# Patient Record
Sex: Female | Born: 1975 | Race: Black or African American | Hispanic: No | Marital: Single | Smoking: Never smoker
Health system: Southern US, Community
[De-identification: ages and names within clinical notes are randomized; demographics above are authoritative.]

## PROBLEM LIST (undated history)

## (undated) DIAGNOSIS — I10 Essential (primary) hypertension: Secondary | ICD-10-CM

## (undated) HISTORY — PX: CHOLECYSTECTOMY: SHX55

## (undated) HISTORY — PX: TONSILLECTOMY: SUR1361

---

## 2014-07-21 ENCOUNTER — Inpatient Hospital Stay: Payer: Self-pay | Admitting: Surgery

## 2014-07-21 LAB — CBC WITH DIFFERENTIAL/PLATELET
BASOS ABS: 0 10*3/uL (ref 0.0–0.1)
BASOS PCT: 0.4 %
EOS ABS: 0 10*3/uL (ref 0.0–0.7)
EOS PCT: 0.2 %
HCT: 43.4 % (ref 35.0–47.0)
HGB: 14 g/dL (ref 12.0–16.0)
LYMPHS ABS: 1.7 10*3/uL (ref 1.0–3.6)
Lymphocyte %: 14.5 %
MCH: 27.2 pg (ref 26.0–34.0)
MCHC: 32.3 g/dL (ref 32.0–36.0)
MCV: 84 fL (ref 80–100)
MONOS PCT: 5.4 %
Monocyte #: 0.6 x10 3/mm (ref 0.2–0.9)
NEUTROS PCT: 79.5 %
Neutrophil #: 9 10*3/uL — ABNORMAL HIGH (ref 1.4–6.5)
Platelet: 336 10*3/uL (ref 150–440)
RBC: 5.16 10*6/uL (ref 3.80–5.20)
RDW: 15.5 % — AB (ref 11.5–14.5)
WBC: 11.4 10*3/uL — AB (ref 3.6–11.0)

## 2014-07-21 LAB — LIPASE, BLOOD: Lipase: 133 U/L (ref 73–393)

## 2014-07-21 LAB — COMPREHENSIVE METABOLIC PANEL
Albumin: 3.3 g/dL — ABNORMAL LOW (ref 3.4–5.0)
Alkaline Phosphatase: 178 U/L — ABNORMAL HIGH
Anion Gap: 7 (ref 7–16)
BILIRUBIN TOTAL: 2 mg/dL — AB (ref 0.2–1.0)
BUN: 10 mg/dL (ref 7–18)
CALCIUM: 8.9 mg/dL (ref 8.5–10.1)
CHLORIDE: 104 mmol/L (ref 98–107)
CREATININE: 1.12 mg/dL (ref 0.60–1.30)
Co2: 27 mmol/L (ref 21–32)
EGFR (Non-African Amer.): >60
Glucose: 124 mg/dL — ABNORMAL HIGH (ref 65–99)
OSMOLALITY: 276 (ref 275–301)
Potassium: 3.8 mmol/L (ref 3.5–5.1)
SGOT(AST): 717 U/L — ABNORMAL HIGH (ref 15–37)
SGPT (ALT): 398 U/L — ABNORMAL HIGH
Sodium: 138 mmol/L (ref 136–145)
Total Protein: 7.9 g/dL (ref 6.4–8.2)

## 2014-07-21 LAB — TROPONIN I

## 2014-07-22 LAB — COMPREHENSIVE METABOLIC PANEL
ALBUMIN: 2.9 g/dL — AB (ref 3.4–5.0)
Alkaline Phosphatase: 202 U/L — ABNORMAL HIGH
Anion Gap: 8 (ref 7–16)
BUN: 9 mg/dL (ref 7–18)
Bilirubin,Total: 2 mg/dL — ABNORMAL HIGH (ref 0.2–1.0)
CALCIUM: 8.3 mg/dL — AB (ref 8.5–10.1)
Chloride: 107 mmol/L (ref 98–107)
Co2: 25 mmol/L (ref 21–32)
Creatinine: 1.08 mg/dL (ref 0.60–1.30)
EGFR (African American): >60
GLUCOSE: 118 mg/dL — AB (ref 65–99)
Osmolality: 279 (ref 275–301)
Potassium: 3.6 mmol/L (ref 3.5–5.1)
SGOT(AST): 610 U/L — ABNORMAL HIGH (ref 15–37)
SGPT (ALT): 508 U/L — ABNORMAL HIGH
Sodium: 140 mmol/L (ref 136–145)
Total Protein: 7.2 g/dL (ref 6.4–8.2)

## 2014-07-22 LAB — CBC WITH DIFFERENTIAL/PLATELET
Basophil #: 0.1 10*3/uL (ref 0.0–0.1)
Basophil %: 0.8 %
Eosinophil #: 0.1 10*3/uL (ref 0.0–0.7)
Eosinophil %: 1 %
HCT: 42.4 % (ref 35.0–47.0)
HGB: 13.2 g/dL (ref 12.0–16.0)
LYMPHS PCT: 19.3 %
Lymphocyte #: 2.1 10*3/uL (ref 1.0–3.6)
MCH: 26.5 pg (ref 26.0–34.0)
MCHC: 31.2 g/dL — ABNORMAL LOW (ref 32.0–36.0)
MCV: 85 fL (ref 80–100)
Monocyte #: 0.7 x10 3/mm (ref 0.2–0.9)
Monocyte %: 6.4 %
NEUTROS ABS: 8 10*3/uL — AB (ref 1.4–6.5)
Neutrophil %: 72.5 %
PLATELETS: 303 10*3/uL (ref 150–440)
RBC: 4.98 10*6/uL (ref 3.80–5.20)
RDW: 15.6 % — AB (ref 11.5–14.5)
WBC: 11.1 10*3/uL — ABNORMAL HIGH (ref 3.6–11.0)

## 2014-07-22 LAB — PREGNANCY, URINE: Pregnancy Test, Urine: NEGATIVE m[IU]/mL

## 2014-07-24 LAB — COMPREHENSIVE METABOLIC PANEL
ALK PHOS: 158 U/L — AB
Albumin: 2.8 g/dL — ABNORMAL LOW (ref 3.4–5.0)
Anion Gap: 7 (ref 7–16)
BUN: 11 mg/dL (ref 7–18)
Bilirubin,Total: 0.4 mg/dL (ref 0.2–1.0)
CALCIUM: 8.5 mg/dL (ref 8.5–10.1)
CHLORIDE: 107 mmol/L (ref 98–107)
CREATININE: 0.96 mg/dL (ref 0.60–1.30)
Co2: 24 mmol/L (ref 21–32)
EGFR (African American): >60
EGFR (Non-African Amer.): >60
Glucose: 102 mg/dL — ABNORMAL HIGH (ref 65–99)
Osmolality: 275 (ref 275–301)
Potassium: 4.2 mmol/L (ref 3.5–5.1)
SGOT(AST): 64 U/L — ABNORMAL HIGH (ref 15–37)
SGPT (ALT): 209 U/L — ABNORMAL HIGH
Sodium: 138 mmol/L (ref 136–145)
TOTAL PROTEIN: 7 g/dL (ref 6.4–8.2)

## 2014-07-25 LAB — PATHOLOGY REPORT

## 2014-08-04 ENCOUNTER — Emergency Department: Payer: Self-pay | Admitting: Emergency Medicine

## 2015-04-17 NOTE — Op Note (Signed)
PATIENT NAME:  Kristie Santos, Kristie MR#:  [id number] DATE OF BIRTH:  1976-01-25  DATE OF PROCEDURE:  07/23/2014  PREOPERATIVE DIAGNOSIS: Symptomatic cholelithiasis.   POSTOPERATIVE DIAGNOSIS: Symptomatic cholelithiasis.   PROCEDURE: Cholecystectomy.  SURGEON: Duwaine MaxinWilliam Beckett Hickmon, M.D.   ANESTHESIA: General.   PROCEDURE IN DETAIL: The patient was placed supine on the operating room table and prepped and draped in the usual sterile fashion. A 15 mmHg CO2 pneumoperitoneum was created via a Veress needle in the infraumbilical position and this was placed with a 5 mm trocar and a 30 degree angled laparoscope. The patient had no inflammation and a fairly small gallbladder. The fundus was retracted superiorly and ventrally. The infundibulum was dissected out. The cystic artery was doubly clipped and divided and then the infundibulum of the gallbladder was noted coned down into a fairly broad cystic duct without any clear definition as to where the gallbladder ended and the cystic duct began. I knew that I was above the porta hepatis as I was above the anterior posterior groove on the visceral surface of the liver, which was quite demarcated. Therefore, at that location, I doubly clipped the cystic duct with Hem-o-Lok clips and divided it. I removed the gallbladder from the liver bed with the electrocautery, placed it in an Endo Catch bag and extracted it from the abdomen via the epigastric port. There was no bile staining. There was no bleeding. The clips were secure. Therefore, I desufflated and decannulated the abdomen and closed all 4 skin sites with subcuticular 5-0 Monocryl and suture strips. The patient tolerated the procedure well. There were no complications.  ____________________________ Kristie MangesWilliam F. Debbra Digiulio, MD wfm:sb D: 07/23/2014 09:54:42 ET T: 07/23/2014 10:11:31 ET JOB#: [id number]  cc: Kristie MangesWilliam F. Kaveri Perras, MD, <Dictator> Kristie MangesWILLIAM F Tajae Rybicki MD ELECTRONICALLY SIGNED 07/23/2014 14:16

## 2015-04-17 NOTE — Consult Note (Signed)
Brief Consult Note: Diagnosis: Sx gallstones.   Patient was seen by consultant.   Recommend to proceed with surgery or procedure.   Orders entered.   Discussed with Attending MD.   Comments: Likely has been passing GSs causing LFT abnormalities and Sx. Pt aware of this and ERCP results. Plan Lap CCY in AM.  Pt understands 1/200 risk of CBD injury, and if it occurs, it could have serious consequences, and She agrees.  Electronic Signatures: Claude MangesMarterre, Haleem Hanner F (MD)  (Signed 29-Jul-15 14:01)  Authored: Brief Consult Note   Last Updated: 29-Jul-15 14:01 by Claude MangesMarterre, Markela Wee F (MD)

## 2015-04-17 NOTE — H&P (Signed)
PATIENT NAME:  Kristie Santos, STRUM MR#:  [id number] DATE OF BIRTH:  January 04, 1976  DATE OF ADMISSION:  07/21/2014  PRIMARY CARE PHYSICIAN: None.   REFERRING EMERGENCY ROOM PHYSICIAN: Dr. Jene Every.   CHIEF COMPLAINT: Abdominal pain.   HISTORY OF PRESENTING ILLNESS: A 39 year old female who does not have a PMD and does not go to a doctor regularly. Today morning she woke up suddenly at 6 a.m. with severe epigastric and abdominal pain. The pain was 10/10 like soreness and lying down in certain positions made it slightly better. She also tried some pain medications which make pain a little better and she was able to take a short nap again but after that again when she woke up the pain was still there. It was like soreness and was radiating to her back. The patient also vomited in the morning her breakfast and after that did not eat anything. No complaint of diarrhea, fever or chills so decided to come to the Emergency Room. On work-up in ER, elevated LFTs with a high bilirubin so right upper quadrant sonogram was done and it showed abnormal intrahepatic and extrahepatic biliary duct dilation with gallstone in gallbladder. So ER physician spoke to Dr. Daleen Squibb, on call GI doctor and he suggested to keep patient n.p.o. overnight and he will do an ERCP tomorrow. The hospitalist was contacted for further management of this issue.   REVIEW OF SYSTEMS:  CONSTITUTIONAL: Negative for fever, fatigue, weakness, have abdominal pain. No weight loss or weight gain.  EYES: No blurring, double vision, discharge or redness.  EARS, NOSE, THROAT: No tinnitus, ear pain or hearing loss.  RESPIRATORY: No cough, wheezing, or shortness of breath.  CARDIOVASCULAR: No chest pain, orthopnea, edema, arrhythmia, palpitation.  GASTROINTESTINAL: The patient had vomiting and severe abdominal pain but no diarrhea.  GENITOURINARY: No dysuria, hematuria, or increased frequency.  ENDOCRINE: No heat or cold intolerance. No excessive  sweating.  SKIN: No acne, rashes, or lesions.  MUSCULOSKELETAL: No pain or swelling in the joints.  NEUROLOGICAL: No numbness, weakness, tremor or vertigo.  PSYCHIATRIC: No anxiety, insomnia, bipolar disorder.   PAST MEDICAL HISTORY: Not known.   PAST SURGICAL HISTORY: Tonsillectomy.   SOCIAL HISTORY: No smoking. No drinking alcohol. No illegal drug use. Working in housekeeping.   FAMILY HISTORY: Positive for diabetes in mother. Her mother died at age of 43 because of complications of aneurysmal rupture in the brain.   MEDICATIONS: Prenatal vitamin medications.  PHYSICAL EXAMINATION:   VITAL SIGNS: In ER, temperature 98, pulse 55, respirations 18, blood pressure 171/104. Pulse oximetry is 98% on room air.  GENERAL: The patient is fully alert, oriented to time, place, and person. Does not appear in acute distress. She is obese.  HEENT: Head and neck atraumatic. Conjunctivae pink. Oral mucosa moist.  NECK: Supple. No JVD.  RESPIRATORY: Bilateral clear and equal air entry.  CARDIOVASCULAR: S1, S2 present, regular. No murmur.  ABDOMEN: Soft, nontender. Bowel sounds present. No organomegaly felt.  SKIN: No acne, rashes, or lesions.  MUSCULOSKELETAL: No pain. No swelling or tenderness in the joints.  NEUROLOGICAL: No numbness, weakness, tremor. No rigidity. Follows commands, (power  5/5.  LEGS: No edema.  JOINTS: No swelling or tenderness.  PSYCHIATRIC: No acute psychiatric illness.   IMPORTANT LAB VALUES: Glucose  124, BUN 10, creatinine 1.12, sodium 138, potassium is 3.8, chloride is 104, CO2 of 27, osmolality 276, calcium 8.9. Lipase is 133.   Total protein 7.9, albumin 3.3, bilirubin 2, alkaline phosphate 178, SGOT 1717 and SGPT  398. Troponin less than 0.02. WBC 11.4, hemoglobin 14, platelet 336,000. MCV is 84.   Ultrasound of abdomen, limited survey is done for right upper quadrant and showed abnormal intrahepatic and extrahepatic biliary duct dilation with gallstone in gallbladder  although we cannot directly visualize choledocholithiasis. The presence of biliary dilation and obvious gallstone in the gallbladder may make choledocholithiasis likely.   ASSESSMENT AND PLAN: A 39 year old female with no past medical history came with severe abdominal pain in the mo63rning, found having intra and extrahepatic ductal dilation with gallstone in the gallbladder.    1. Gallbladder stone with intra and extrahepatic ductal dilation and severe pain. ER physician spoke to Dr. Daleen SquibbWall, Gastrointestinal on-call doctor and he is willing to do ERCP tomorrow morning, suggested to keep the patient n.p.o. overnight and manage symptomatically. Plan discussed with the patient and she agrees for that and will do as needed.  2. Elevated LFTs. Most likely this is secondary to gallstone and management per GI tomorrow.  3. Elevated white cell count. This might be due to  stress of pain. No source of infection yet. Continue monitoring.  4. Hypertension. Blood pressure is high 170/104. This might be due to pain. Continue pain management and address later on if still remains high.   CODE STATUS: Full code.   TOTAL TIME SPENT ON THIS ADMISSION: 50 minutes.    ____________________________ Hope PigeonVaibhavkumar G. Elisabeth PigeonVachhani, MD vgv:jh D: 07/21/2014 21:20:40 ET T: 07/21/2014 22:01:16 ET JOB#: [id number]  cc: Hope PigeonVaibhavkumar G. Elisabeth PigeonVachhani, MD, <Dictator> Altamese DillingVAIBHAVKUMAR Nykia Turko MD ELECTRONICALLY SIGNED 08/03/2014 8:15

## 2015-04-17 NOTE — Discharge Summary (Signed)
PATIENT NAME:  Kristie DresserWILLIAMSON, Sinclaire MR#:  [id number] DATE OF BIRTH:  1976-11-24  DATE OF ADMISSION:  07/21/2014 DATE OF DISCHARGE:  07/24/2014  PRINCIPAL DIAGNOSIS: Symptomatic cholelithiasis (history of passing common bile duct stones).   OTHER DIAGNOSES: 1.  Hypertension. 2.  Obesity.  PRINCIPAL PROCEDURE PERFORMED DURING THIS ADMISSION: Laparoscopic cholecystectomy, July 23, 2014.   OTHER PROCEDURES PERFORMED DURING THIS ADMISSION: ERCP July 22, 2014.   HOSPITAL COURSE: The patient was admitted to the hospital, given IV antibiotics and underwent the above-mentioned procedures for the above-mentioned diagnosis, and on postoperative day 1 she was doing well and discharged home on Norco. She was asked to make an appointment to see me in 2 weeks and to call the office in the interim for any problems.   ____________________________ Claude MangesWilliam F. Christalyn Goertz, MD wfm:sb D: 07/24/2014 10:30:31 ET T: 07/24/2014 12:24:07 ET JOB#: [id number]  cc: Claude MangesWilliam F. Itzamara Casas, MD, <Dictator> Claude MangesWILLIAM F Genella Bas MD ELECTRONICALLY SIGNED 07/25/2014 9:34

## 2015-06-18 ENCOUNTER — Other Ambulatory Visit: Payer: Self-pay

## 2015-06-18 ENCOUNTER — Emergency Department
Admission: EM | Admit: 2015-06-18 | Discharge: 2015-06-18 | Disposition: A | Discharge status: Home / Self Care | Payer: Commercial Managed Care - PPO | Attending: Emergency Medicine | Admitting: Emergency Medicine

## 2015-06-18 ENCOUNTER — Encounter: Payer: Self-pay | Admitting: *Deleted

## 2015-06-18 ENCOUNTER — Emergency Department: Payer: Commercial Managed Care - PPO

## 2015-06-18 DIAGNOSIS — R079 Chest pain, unspecified: Secondary | ICD-10-CM

## 2015-06-18 DIAGNOSIS — R0789 Other chest pain: Secondary | ICD-10-CM | POA: Diagnosis present

## 2015-06-18 DIAGNOSIS — Z3202 Encounter for pregnancy test, result negative: Secondary | ICD-10-CM | POA: Diagnosis not present

## 2015-06-18 LAB — CBC WITH DIFFERENTIAL/PLATELET
BASOS PCT: 1 %
Basophils Absolute: 0.1 10*3/uL (ref 0–0.1)
Eosinophils Absolute: 0.2 10*3/uL (ref 0–0.7)
Eosinophils Relative: 2 %
HCT: 40.7 % (ref 35.0–47.0)
Hemoglobin: 12.9 g/dL (ref 12.0–16.0)
LYMPHS ABS: 3.1 10*3/uL (ref 1.0–3.6)
Lymphocytes Relative: 26 %
MCH: 26 pg (ref 26.0–34.0)
MCHC: 31.6 g/dL — AB (ref 32.0–36.0)
MCV: 82.2 fL (ref 80.0–100.0)
Monocytes Absolute: 0.7 10*3/uL (ref 0.2–0.9)
Monocytes Relative: 6 %
NEUTROS PCT: 65 %
Neutro Abs: 7.7 10*3/uL — ABNORMAL HIGH (ref 1.4–6.5)
Platelets: 325 10*3/uL (ref 150–440)
RBC: 4.95 MIL/uL (ref 3.80–5.20)
RDW: 15.5 % — ABNORMAL HIGH (ref 11.5–14.5)
WBC: 11.8 10*3/uL — ABNORMAL HIGH (ref 3.6–11.0)

## 2015-06-18 LAB — COMPREHENSIVE METABOLIC PANEL
ALK PHOS: 90 U/L (ref 38–126)
ALT: 29 U/L (ref 14–54)
ANION GAP: 6 (ref 5–15)
AST: 44 U/L — ABNORMAL HIGH (ref 15–41)
Albumin: 3.8 g/dL (ref 3.5–5.0)
BILIRUBIN TOTAL: 0.5 mg/dL (ref 0.3–1.2)
BUN: 13 mg/dL (ref 6–20)
CHLORIDE: 104 mmol/L (ref 101–111)
CO2: 29 mmol/L (ref 22–32)
Calcium: 9 mg/dL (ref 8.9–10.3)
Creatinine, Ser: 1.17 mg/dL — ABNORMAL HIGH (ref 0.44–1.00)
GFR, EST NON AFRICAN AMERICAN: 58 mL/min — AB (ref >60)
GLUCOSE: 108 mg/dL — AB (ref 65–99)
POTASSIUM: 4 mmol/L (ref 3.5–5.1)
SODIUM: 139 mmol/L (ref 135–145)
TOTAL PROTEIN: 7.4 g/dL (ref 6.5–8.1)

## 2015-06-18 LAB — TROPONIN I: Troponin I: <0.03 ng/mL (ref <0.031)

## 2015-06-18 NOTE — Discharge Instructions (Signed)

## 2015-06-18 NOTE — ED Notes (Signed)
Pt presents to ED for c/o mid sternal chest pain that woke her up at 0400, sharp in nature with pain to her back. Pt states pain and symptoms are similar to 1 year ago when she awoke with same symptoms and ending up having her gallbladder removed. Denies N/V, diaphoresis. Pt is AOx4 in no apparent distress during assessment.

## 2015-06-18 NOTE — ED Provider Notes (Signed)
[facility] Emergency Department Provider Note  ____________________________________________  Time seen: 7 AM  I have reviewed the triage vital signs and the nursing notes.   HISTORY  Chief Complaint Chest Pain    HPI Kristie Santos is a 39 y.o. female who woke up this morning at 4 AM with burning/sharp pain in the center of her chest radiating from her epigastrium to her throat. She reports the pain is improved. She denies shortness of breath. She denies nausea vomiting. No diaphoresis. She has had similar problems in the past and has required her gallbladder to be removed. She has no history of coronary artery disease. Denies a family history of coronary artery disease. She currently has no chest pain and feels well     History reviewed. No pertinent past medical history. Hypertension? There are no active problems to display for this patient.   Past Surgical History  Procedure Laterality Date  . Cholecystectomy    . Tonsillectomy      No current outpatient prescriptions on file.  Allergies Review of patient's allergies indicates no known allergies.  No family history on file.  Social History History  Substance Use Topics  . Smoking status: Never Smoker   . Smokeless tobacco: Not on file  . Alcohol Use: No   denies drug abuse  Review of Systems  Constitutional: Negative for fever. Eyes: Negative for visual changes. ENT: Negative for sore throat Cardiovascular: Positive for chest pain Respiratory: Negative for shortness of breath. Gastrointestinal: Negative for abdominal pain, vomiting and diarrhea. Genitourinary: Negative for dysuria. Musculoskeletal: Negative for back pain. Skin: Negative for rash. Neurological: Negative for headaches or focal weakness Psychiatric: No anxiety  10-point ROS otherwise negative.  ____________________________________________   PHYSICAL EXAM:  VITAL SIGNS: ED Triage Vitals  Enc Vitals Group      BP 06/18/15 0501 151/76 mmHg     Pulse Rate 06/18/15 0501 57     Resp 06/18/15 0501 16     Temp 06/18/15 0501 97.8 F (36.6 C)     Temp Source 06/18/15 0501 Oral     SpO2 06/18/15 0501 99 %     Weight 06/18/15 0501 234 lb (106.142 kg)     Height 06/18/15 0501  (1.549 m)     Head Cir --      Peak Flow --      Pain Score 06/18/15 0504 7     Pain Loc --      Pain Edu? --      Excl. in GC? --      Constitutional: Alert and oriented. Well appearing and in no distress. Eyes: Conjunctivae are normal.  ENT   Head: Normocephalic and atraumatic.   Mouth/Throat: Mucous membranes are moist. Cardiovascular: Normal rate, regular rhythm. Normal and symmetric distal pulses are present in all extremities. No murmurs, rubs, or gallops. Respiratory: Normal respiratory effort without tachypnea nor retractions. Breath sounds are clear and equal bilaterally.  Gastrointestinal: Soft and non-tender in all quadrants. No distention. There is no CVA tenderness. Genitourinary: deferred Musculoskeletal: Nontender with normal range of motion in all extremities. No lower extremity tenderness nor edema. Neurologic:  Normal speech and language. No gross focal neurologic deficits are appreciated. Skin:  Skin is warm, dry and intact. No rash noted. Psychiatric: Mood and affect are normal. Patient exhibits appropriate insight and judgment.  ____________________________________________    LABS (pertinent positives/negatives)  Labs Reviewed  CBC WITH DIFFERENTIAL/PLATELET - Abnormal; Notable for the following:    WBC 11.8 (*)  MCHC 31.6 (*)    RDW 15.5 (*)    Neutro Abs 7.7 (*)    All other components within normal limits  COMPREHENSIVE METABOLIC PANEL  TROPONIN I    ____________________________________________   EKG  ED ECG REPORT I, Jene Every, the attending physician, personally viewed and interpreted this ECG.   Date: 06/18/2015  EKG Time: 5:14 AM  Rate: 49  Rhythm:  sinus bradycardia  Axis: Normal axis  Intervals:none  ST&T Change: Normal   ____________________________________________    RADIOLOGY  Chest x-ray viewed by me, no acute distress  ____________________________________________   PROCEDURES  Procedure(s) performed: none  Critical Care performed: none  ____________________________________________   INITIAL IMPRESSION / ASSESSMENT AND PLAN / ED COURSE  Pertinent labs & imaging results that were available during my care of the patient were reviewed by me and considered in my medical decision making (see chart for details).  Patient overall well-appearing, history of present illness and risk factors to demonstrate a low risk for ACS however we will check labs, chest x-ray and reevaluate. She is chest pain-free.  ----------------------------------------- 8:29 AM on 06/18/2015 -----------------------------------------  Labs x-ray benign. Patient is chest pain-free. She has a very slight bradycardia but no other abnormalities on EKG. History of present illness not consistent with ACS or PE. She agrees to follow up with cardiology for further follow-up of her chest pain and bradycardia  ____________________________________________   FINAL CLINICAL IMPRESSION(S) / ED DIAGNOSES  Final diagnoses:  Chest pain, unspecified chest pain type     Jene Every, MD 06/18/15 [phone]

## 2015-06-18 NOTE — ED Notes (Signed)
Pt presents to ED for c/o mid sternal chest pain that woke her up at 0400, sharp in nature with pain to her back. Pt states pain and symptoms are similar to 1 year ago when she awoke with same symptoms and ending up having her gallbladder removed. Denies N/V, diaphoresis.

## 2015-06-19 LAB — POCT PREGNANCY, URINE: Preg Test, Ur: NEGATIVE

## 2019-06-07 ENCOUNTER — Emergency Department (HOSPITAL_COMMUNITY): Payer: Self-pay

## 2019-06-07 ENCOUNTER — Encounter (HOSPITAL_COMMUNITY): Payer: Self-pay

## 2019-06-07 ENCOUNTER — Emergency Department (HOSPITAL_COMMUNITY)
Admission: EM | Admit: 2019-06-07 | Discharge: 2019-06-07 | Disposition: A | Discharge status: Home / Self Care | Payer: Self-pay | Attending: Emergency Medicine | Admitting: Emergency Medicine

## 2019-06-07 ENCOUNTER — Other Ambulatory Visit: Payer: Self-pay

## 2019-06-07 DIAGNOSIS — R1084 Generalized abdominal pain: Secondary | ICD-10-CM | POA: Insufficient documentation

## 2019-06-07 DIAGNOSIS — R197 Diarrhea, unspecified: Secondary | ICD-10-CM | POA: Insufficient documentation

## 2019-06-07 DIAGNOSIS — K55069 Acute infarction of intestine, part and extent unspecified: Secondary | ICD-10-CM | POA: Insufficient documentation

## 2019-06-07 LAB — URINALYSIS, ROUTINE W REFLEX MICROSCOPIC
Bacteria, UA: NONE SEEN
Bilirubin Urine: NEGATIVE
Glucose, UA: 50 mg/dL — AB
Ketones, ur: NEGATIVE mg/dL
Nitrite: NEGATIVE
Protein, ur: 100 mg/dL — AB
RBC / HPF: >50 RBC/hpf — ABNORMAL HIGH (ref 0–5)
Specific Gravity, Urine: 1.03 (ref 1.005–1.030)
WBC, UA: >50 WBC/hpf — ABNORMAL HIGH (ref 0–5)
pH: 5 (ref 5.0–8.0)

## 2019-06-07 LAB — CBC WITH DIFFERENTIAL/PLATELET
Abs Immature Granulocytes: 0.31 10*3/uL — ABNORMAL HIGH (ref 0.00–0.07)
Basophils Absolute: 0.1 10*3/uL (ref 0.0–0.1)
Basophils Relative: 0 %
Eosinophils Absolute: 0 10*3/uL (ref 0.0–0.5)
Eosinophils Relative: 0 %
HCT: 41.6 % (ref 36.0–46.0)
Hemoglobin: 12.8 g/dL (ref 12.0–15.0)
Immature Granulocytes: 1 %
Lymphocytes Relative: 9 %
Lymphs Abs: 2.5 10*3/uL (ref 0.7–4.0)
MCH: 25.6 pg — ABNORMAL LOW (ref 26.0–34.0)
MCHC: 30.8 g/dL (ref 30.0–36.0)
MCV: 83.2 fL (ref 80.0–100.0)
Monocytes Absolute: 1.3 10*3/uL — ABNORMAL HIGH (ref 0.1–1.0)
Monocytes Relative: 5 %
Neutro Abs: 23.1 10*3/uL — ABNORMAL HIGH (ref 1.7–7.7)
Neutrophils Relative %: 85 %
Platelets: 341 10*3/uL (ref 150–400)
RBC: 5 MIL/uL (ref 3.87–5.11)
RDW: 15.9 % — ABNORMAL HIGH (ref 11.5–15.5)
WBC: 27.1 10*3/uL — ABNORMAL HIGH (ref 4.0–10.5)
nRBC: 0 % (ref 0.0–0.2)

## 2019-06-07 LAB — COMPREHENSIVE METABOLIC PANEL
ALT: 27 U/L (ref 0–44)
AST: 24 U/L (ref 15–41)
Albumin: 3.2 g/dL — ABNORMAL LOW (ref 3.5–5.0)
Alkaline Phosphatase: 71 U/L (ref 38–126)
Anion gap: 11 (ref 5–15)
BUN: 11 mg/dL (ref 6–20)
CO2: 20 mmol/L — ABNORMAL LOW (ref 22–32)
Calcium: 8.9 mg/dL (ref 8.9–10.3)
Chloride: 106 mmol/L (ref 98–111)
Creatinine, Ser: 1.39 mg/dL — ABNORMAL HIGH (ref 0.44–1.00)
GFR calc Af Amer: 54 mL/min — ABNORMAL LOW (ref >60)
GFR calc non Af Amer: 47 mL/min — ABNORMAL LOW (ref >60)
Glucose, Bld: 129 mg/dL — ABNORMAL HIGH (ref 70–99)
Potassium: 2.9 mmol/L — ABNORMAL LOW (ref 3.5–5.1)
Sodium: 137 mmol/L (ref 135–145)
Total Bilirubin: 2.3 mg/dL — ABNORMAL HIGH (ref 0.3–1.2)
Total Protein: 7.3 g/dL (ref 6.5–8.1)

## 2019-06-07 LAB — I-STAT BETA HCG BLOOD, ED (MC, WL, AP ONLY): I-stat hCG, quantitative: <5 m[IU]/mL (ref <5)

## 2019-06-07 LAB — LIPASE, BLOOD: Lipase: 26 U/L (ref 11–51)

## 2019-06-07 LAB — POC OCCULT BLOOD, ED: Fecal Occult Bld: NEGATIVE

## 2019-06-07 LAB — MAGNESIUM: Magnesium: 1.8 mg/dL (ref 1.7–2.4)

## 2019-06-07 MED ORDER — KETOROLAC TROMETHAMINE 15 MG/ML IJ SOLN
15.0000 mg | Freq: Once | INTRAMUSCULAR | Status: AC
  Administered 2019-06-07: 15 mg via INTRAVENOUS
  Filled 2019-06-07: qty 1

## 2019-06-07 MED ORDER — SODIUM CHLORIDE 0.9 % IV BOLUS (SEPSIS)
1000.0000 mL | Freq: Once | INTRAVENOUS | Status: AC
  Administered 2019-06-07: 1000 mL via INTRAVENOUS

## 2019-06-07 MED ORDER — IOHEXOL 300 MG/ML  SOLN
100.0000 mL | Freq: Once | INTRAMUSCULAR | Status: AC | PRN
  Administered 2019-06-07: 100 mL via INTRAVENOUS

## 2019-06-07 MED ORDER — POTASSIUM CHLORIDE CRYS ER 20 MEQ PO TBCR
40.0000 meq | EXTENDED_RELEASE_TABLET | Freq: Once | ORAL | Status: AC
  Administered 2019-06-07: 40 meq via ORAL
  Filled 2019-06-07: qty 2

## 2019-06-07 MED ORDER — IBUPROFEN 800 MG PO TABS: 800.0000 mg | ORAL_TABLET | Freq: Three times a day (TID) | ORAL | 0 refills | Status: DC

## 2019-06-07 NOTE — Discharge Instructions (Signed)
Thank you for allowing me to care for you today. Please return to the emergency department if you have ANY new or worsening symptoms. Take your medications as instructed. You may take over the counter imodium for diarrhea. Make sure to drink PLENTY of water and return if you are unable to do so.

## 2019-06-07 NOTE — ED Notes (Signed)
Patient transported to CT 

## 2019-06-07 NOTE — ED Provider Notes (Signed)
[facility] Provider Note   CSN: [id number] Arrival date & time: 06/07/19  1328    History   Chief Complaint Chief Complaint  Patient presents with  . Diarrhea  . Abdominal Pain    HPI Kristie Santos is a 43 y.o. female.     Patient is a 43 year old female with past medical history of cholecystectomy who presents to the emergency department for abdominal pain and diarrhea since last night.  Patient reports about 5 episodes of watery diarrhea with crampy abdominal pain over her entire abdomen.  She denies any fevers, chills, nausea, vomiting, dysuria.  She reports that she is on her menstrual cycle so she is unsure if there is blood in her stool.  Reports the pain is 8 out of 10.  No exacerbating or relieving factors.  No history of the same in the past.  Denies known sick contacts, denies recent antibiotics or dietary changes.     History reviewed. No pertinent past medical history.  There are no active problems to display for this patient.   Past Surgical History:  Procedure Laterality Date  . CHOLECYSTECTOMY    . TONSILLECTOMY       OB History   No obstetric history on file.      Home Medications    Prior to Admission medications   Medication Sig Start Date End Date Taking? Authorizing Provider  ibuprofen (ADVIL) 800 MG tablet Take 1 tablet (800 mg total) by mouth 3 (three) times daily. 06/07/19   Arlyn DunningMcLean, Thayden Lemire A, PA-C    Family History No family history on file.  Social History Social History   Tobacco Use  . Smoking status: Never Smoker  Substance Use Topics  . Alcohol use: No  . Drug use: No     Allergies   Patient has no known allergies.   Review of Systems Review of Systems  Constitutional: Negative for chills, fatigue and fever.  HENT: Negative for ear pain and sore throat.   Eyes: Negative for visual disturbance.  Respiratory: Negative for cough and shortness of breath.   Cardiovascular:  Negative for chest pain and palpitations.  Gastrointestinal: Positive for abdominal pain and diarrhea. Negative for abdominal distention, anal bleeding, constipation, nausea, rectal pain and vomiting.  Genitourinary: Negative for dysuria, flank pain, hematuria and pelvic pain.  Musculoskeletal: Negative for arthralgias and back pain.  Skin: Negative for rash.  Allergic/Immunologic: Negative for immunocompromised state.  Neurological: Negative for dizziness and syncope.  Hematological: Does not bruise/bleed easily.  All other systems reviewed and are negative.    Physical Exam Updated Vital Signs BP 127/77   Pulse 88   Temp 99.4 F (37.4 C) (Oral)   Resp 16   LMP 06/05/2019 (Exact Date)   SpO2 96%   Physical Exam Vitals signs and nursing note reviewed. Exam conducted with a chaperone present.  Constitutional:      General: She is not in acute distress.    Appearance: Normal appearance. She is well-developed. She is obese. She is not ill-appearing, toxic-appearing or diaphoretic.  HENT:     Head: Normocephalic.     Mouth/Throat:     Mouth: Mucous membranes are moist.  Eyes:     Conjunctiva/sclera: Conjunctivae normal.  Cardiovascular:     Rate and Rhythm: Normal rate and regular rhythm.  Pulmonary:     Effort: Pulmonary effort is normal.  Abdominal:     General: Abdomen is flat. Bowel sounds are normal.     Palpations: Abdomen  is soft. There is no shifting dullness.     Tenderness: There is generalized abdominal tenderness. There is no right CVA tenderness, left CVA tenderness, guarding or rebound. Negative signs include Murphy's sign and Rovsing's sign.  Genitourinary:    Rectum: No mass or tenderness.  Skin:    General: Skin is warm and dry.  Neurological:     Mental Status: She is alert.  Psychiatric:        Mood and Affect: Mood normal.      ED Treatments / Results  Labs (all labs ordered are listed, but only abnormal results are displayed) Labs Reviewed   CBC WITH DIFFERENTIAL/PLATELET - Abnormal; Notable for the following components:      Result Value   WBC 27.1 (*)    MCH 25.6 (*)    RDW 15.9 (*)    Neutro Abs 23.1 (*)    Monocytes Absolute 1.3 (*)    Abs Immature Granulocytes 0.31 (*)    All other components within normal limits  COMPREHENSIVE METABOLIC PANEL - Abnormal; Notable for the following components:   Potassium 2.9 (*)    CO2 20 (*)    Glucose, Bld 129 (*)    Creatinine, Ser 1.39 (*)    Albumin 3.2 (*)    Total Bilirubin 2.3 (*)    GFR calc non Af Amer 47 (*)    GFR calc Af Amer 54 (*)    All other components within normal limits  URINALYSIS, ROUTINE W REFLEX MICROSCOPIC - Abnormal; Notable for the following components:   Color, Urine AMBER (*)    APPearance CLOUDY (*)    Glucose, UA 50 (*)    Hgb urine dipstick LARGE (*)    Protein, ur 100 (*)    Leukocytes,Ua TRACE (*)    RBC / HPF >50 (*)    WBC, UA >50 (*)    All other components within normal limits  LIPASE, BLOOD  MAGNESIUM  POC OCCULT BLOOD, ED  I-STAT BETA HCG BLOOD, ED ([facility], [facility], [facility] ONLY)    EKG None  Radiology Ct Abdomen Pelvis W Contrast  Result Date: 06/07/2019 CLINICAL DATA:  Pain since last night.  Mid abdominal pain EXAM: CT ABDOMEN AND PELVIS WITH CONTRAST TECHNIQUE: Multidetector CT imaging of the abdomen and pelvis was performed using the standard protocol following bolus administration of intravenous contrast. CONTRAST:  100mL OMNIPAQUE IOHEXOL 300 MG/ML  SOLN COMPARISON:  Mid abdominal pain FINDINGS: Lower chest: Lung bases are clear. Hepatobiliary: No focal hepatic lesion. Postcholecystectomy. No biliary dilatation. Pancreas: Pancreas is normal. No ductal dilatation. No pancreatic inflammation. Spleen: Normal spleen Adrenals/urinary tract: Adrenal glands and kidneys are normal. The ureters and bladder normal. Stomach/Bowel: Small-bowel normal. Terminal ileum and appendix are normal. Ascending and transverse colon normal. Descending colon and  rectosigmoid colon normal. There is mild mesenteric stranding in the RIGHT the greater omentum (image 44/3). No nodularity. Subtle finding adjacent to the cecum and small bowel. Vascular/Lymphatic: Abdominal aorta is normal caliber. No periportal or retroperitoneal adenopathy. No pelvic adenopathy. Reproductive: Uterus and necks are normal. Other: No free fluid. Musculoskeletal: No aggressive osseous lesion. IMPRESSION: 1. Subtle inflammation within the RIGHT omentum with no associated bowel abnormality. Favor omental infarction. 2. Postcholecystectomy without complication. 3. Normal appendix. 4. Normal ovaries and uterus. Electronically Signed   By: Genevive BiStewart  Edmunds M.D.   On: 06/07/2019 16:35    Procedures Procedures (including critical care time)  Medications Ordered in ED Medications  ketorolac (TORADOL) 15 MG/ML injection 15 mg (15 mg  Intravenous Given 06/07/19 1414)  sodium chloride 0.9 % bolus 1,000 mL (0 mLs Intravenous Stopped 06/07/19 1640)  potassium chloride SA (K-DUR) CR tablet 40 mEq (40 mEq Oral Given 06/07/19 1505)  iohexol (OMNIPAQUE) 300 MG/ML solution 100 mL (100 mLs Intravenous Contrast Given 06/07/19 1558)     Initial Impression / Assessment and Plan / ED Course  I have reviewed the triage vital signs and the nursing notes.  Pertinent labs & imaging results that were available during my care of the patient were reviewed by me and considered in my medical decision making (see chart for details).  Clinical Course as of Jun 06 1654  Sat Jun 07, 2019  1645 Patient seen for acute onset of diffuse abdominal pain and diarrhea.  Afebrile with normal vital signs.  Labs significant with dehydration with a small bump in the creatinine to 1.39 and a potassium of 2.9.  Bilirubin 2.3 but otherwise AST and ALT are normal.  Elevated white count to 27.  CT scan findings are concerning only for possible omentum infarction.  This may be the cause of patient's abdominal pain.  However, this would  not cause diarrhea.  I favor that this patient probably has a viral gastroenteritis.  She was given fluids and Toradol.  She reports significant improvement in her pain with no episodes of diarrhea here in the emergency department.  I think she is okay to go home with strict return precautions.  This was discussed with the patient and she agrees that she would like to go home.  We will give NSAID treatment.  Her occult stool was negative.  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

## 2019-06-07 NOTE — ED Triage Notes (Signed)
Pt presents with c/o upper quadrant abdominal pain and diarrhea since last night. Pt endorses x5 episodes of diarrhea. Pt states pain is constant. Pt denies NV, denies difficulty with urination. Pt denies any sick contacts.

## 2019-07-19 ENCOUNTER — Encounter: Payer: Self-pay | Admitting: Emergency Medicine

## 2019-07-19 ENCOUNTER — Emergency Department: Payer: Self-pay

## 2019-07-19 ENCOUNTER — Inpatient Hospital Stay
Admission: EM | Admit: 2019-07-19 | Discharge: 2019-07-27 | DRG: 872 | Disposition: A | Discharge status: Home / Self Care | Payer: Self-pay | Attending: Internal Medicine | Admitting: Internal Medicine

## 2019-07-19 ENCOUNTER — Other Ambulatory Visit: Payer: Self-pay

## 2019-07-19 DIAGNOSIS — N739 Female pelvic inflammatory disease, unspecified: Secondary | ICD-10-CM | POA: Diagnosis present

## 2019-07-19 DIAGNOSIS — N9489 Other specified conditions associated with female genital organs and menstrual cycle: Secondary | ICD-10-CM

## 2019-07-19 DIAGNOSIS — N390 Urinary tract infection, site not specified: Secondary | ICD-10-CM | POA: Diagnosis present

## 2019-07-19 DIAGNOSIS — N179 Acute kidney failure, unspecified: Secondary | ICD-10-CM | POA: Diagnosis present

## 2019-07-19 DIAGNOSIS — N7093 Salpingitis and oophoritis, unspecified: Secondary | ICD-10-CM | POA: Diagnosis present

## 2019-07-19 DIAGNOSIS — A419 Sepsis, unspecified organism: Principal | ICD-10-CM | POA: Diagnosis present

## 2019-07-19 DIAGNOSIS — E860 Dehydration: Secondary | ICD-10-CM | POA: Diagnosis present

## 2019-07-19 DIAGNOSIS — I10 Essential (primary) hypertension: Secondary | ICD-10-CM | POA: Diagnosis present

## 2019-07-19 DIAGNOSIS — Z20828 Contact with and (suspected) exposure to other viral communicable diseases: Secondary | ICD-10-CM

## 2019-07-19 DIAGNOSIS — E876 Hypokalemia: Secondary | ICD-10-CM | POA: Diagnosis present

## 2019-07-19 DIAGNOSIS — R6520 Severe sepsis without septic shock: Secondary | ICD-10-CM | POA: Diagnosis present

## 2019-07-19 DIAGNOSIS — Z6841 Body Mass Index (BMI) 40.0 and over, adult: Secondary | ICD-10-CM

## 2019-07-19 DIAGNOSIS — N83201 Unspecified ovarian cyst, right side: Secondary | ICD-10-CM | POA: Diagnosis present

## 2019-07-19 DIAGNOSIS — E872 Acidosis: Secondary | ICD-10-CM | POA: Diagnosis present

## 2019-07-19 DIAGNOSIS — R197 Diarrhea, unspecified: Secondary | ICD-10-CM

## 2019-07-19 HISTORY — DX: Essential (primary) hypertension: I10

## 2019-07-19 LAB — URINALYSIS, COMPLETE (UACMP) WITH MICROSCOPIC
Glucose, UA: NEGATIVE mg/dL
Ketones, ur: 5 mg/dL — AB
Nitrite: NEGATIVE
Protein, ur: 100 mg/dL — AB
Specific Gravity, Urine: 1.032 — ABNORMAL HIGH (ref 1.005–1.030)
WBC, UA: >50 WBC/hpf — ABNORMAL HIGH (ref 0–5)
pH: 5 (ref 5.0–8.0)

## 2019-07-19 LAB — GASTROINTESTINAL PANEL BY PCR, STOOL (REPLACES STOOL CULTURE)

## 2019-07-19 LAB — COMPREHENSIVE METABOLIC PANEL
ALT: 23 U/L (ref 0–44)
AST: 23 U/L (ref 15–41)
Albumin: 3.5 g/dL (ref 3.5–5.0)
Alkaline Phosphatase: 65 U/L (ref 38–126)
Anion gap: 9 (ref 5–15)
BUN: 17 mg/dL (ref 6–20)
CO2: 21 mmol/L — ABNORMAL LOW (ref 22–32)
Calcium: 8.5 mg/dL — ABNORMAL LOW (ref 8.9–10.3)
Chloride: 105 mmol/L (ref 98–111)
Creatinine, Ser: 1.64 mg/dL — ABNORMAL HIGH (ref 0.44–1.00)
GFR calc Af Amer: 44 mL/min — ABNORMAL LOW (ref >60)
GFR calc non Af Amer: 38 mL/min — ABNORMAL LOW (ref >60)
Glucose, Bld: 139 mg/dL — ABNORMAL HIGH (ref 70–99)
Potassium: 2.9 mmol/L — ABNORMAL LOW (ref 3.5–5.1)
Sodium: 135 mmol/L (ref 135–145)
Total Bilirubin: 1.3 mg/dL — ABNORMAL HIGH (ref 0.3–1.2)
Total Protein: 7.8 g/dL (ref 6.5–8.1)

## 2019-07-19 LAB — POCT PREGNANCY, URINE: Preg Test, Ur: NEGATIVE

## 2019-07-19 LAB — CBC
HCT: 39.2 % (ref 36.0–46.0)
Hemoglobin: 12.5 g/dL (ref 12.0–15.0)
MCH: 25.6 pg — ABNORMAL LOW (ref 26.0–34.0)
MCHC: 31.9 g/dL (ref 30.0–36.0)
MCV: 80.2 fL (ref 80.0–100.0)
Platelets: 254 10*3/uL (ref 150–400)
RBC: 4.89 MIL/uL (ref 3.87–5.11)
RDW: 16.5 % — ABNORMAL HIGH (ref 11.5–15.5)
WBC: 25.6 10*3/uL — ABNORMAL HIGH (ref 4.0–10.5)
nRBC: 0 % (ref 0.0–0.2)

## 2019-07-19 LAB — LIPASE, BLOOD: Lipase: 29 U/L (ref 11–51)

## 2019-07-19 LAB — C DIFFICILE QUICK SCREEN W PCR REFLEX
C Diff antigen: NEGATIVE
C Diff interpretation: NOT DETECTED
C Diff toxin: NEGATIVE

## 2019-07-19 LAB — SARS CORONAVIRUS 2 BY RT PCR (HOSPITAL ORDER, PERFORMED IN ~~LOC~~ HOSPITAL LAB): SARS Coronavirus 2: NEGATIVE

## 2019-07-19 MED ORDER — ACETAMINOPHEN 325 MG PO TABS
650.0000 mg | ORAL_TABLET | Freq: Once | ORAL | Status: AC
  Administered 2019-07-19: 650 mg via ORAL
  Filled 2019-07-19: qty 2

## 2019-07-19 MED ORDER — IOHEXOL 300 MG/ML  SOLN
30.0000 mL | Freq: Once | INTRAMUSCULAR | Status: AC | PRN
  Administered 2019-07-19: 15:00:00 30 mL via ORAL

## 2019-07-19 MED ORDER — CIPROFLOXACIN IN D5W 400 MG/200ML IV SOLN
400.0000 mg | Freq: Once | INTRAVENOUS | Status: AC
  Administered 2019-07-19: 400 mg via INTRAVENOUS
  Filled 2019-07-19: qty 200

## 2019-07-19 MED ORDER — POTASSIUM CHLORIDE CRYS ER 20 MEQ PO TBCR
40.0000 meq | EXTENDED_RELEASE_TABLET | Freq: Once | ORAL | Status: AC
  Administered 2019-07-19: 40 meq via ORAL
  Filled 2019-07-19: qty 2

## 2019-07-19 MED ORDER — SODIUM CHLORIDE 0.9 % IV BOLUS
1000.0000 mL | Freq: Once | INTRAVENOUS | Status: AC
  Administered 2019-07-19: 1000 mL via INTRAVENOUS

## 2019-07-19 MED ORDER — METRONIDAZOLE IN NACL 5-0.79 MG/ML-% IV SOLN
500.0000 mg | Freq: Once | INTRAVENOUS | Status: AC
  Administered 2019-07-19: 500 mg via INTRAVENOUS
  Filled 2019-07-19: qty 100

## 2019-07-19 MED ORDER — IOHEXOL 300 MG/ML  SOLN
100.0000 mL | Freq: Once | INTRAMUSCULAR | Status: AC | PRN
  Administered 2019-07-19: 100 mL via INTRAVENOUS

## 2019-07-19 MED ORDER — SODIUM CHLORIDE 0.9 % IV BOLUS
500.0000 mL | Freq: Once | INTRAVENOUS | Status: AC
  Administered 2019-07-19: 500 mL via INTRAVENOUS

## 2019-07-19 NOTE — ED Notes (Signed)
Patient transported to Ultrasound 

## 2019-07-19 NOTE — H&P (Signed)
[facility] [place] at [facility]   PATIENT NAME: Kristie DresserKristy Santos    MR#:  [id number]  DATE OF BIRTH:  04/04/1976  DATE OF ADMISSION:  07/19/2019  PRIMARY CARE PHYSICIAN: Patient, No Pcp Per   REQUESTING/REFERRING PHYSICIAN: Siadecki, MD  CHIEF COMPLAINT:   Chief Complaint  Patient presents with  . Abdominal Pain  . Diarrhea    HISTORY OF PRESENT ILLNESS:  Kristie DresserKristy Bellucci  is a 43 y.o. female who presents with chief complaint as above.  Patient presents the ED with a complaint of abdominal pain and diarrhea.  She states is been going on overall for about a month now.  She says she was seen a few times in the interim.  At some point she was placed on amoxicillin.  On evaluation here tonight she appears to have a UTI based on her urinalysis results.  She has a significant leukocytosis as well.  On chart review it seems like she likely had this UTI previously as well.  C. difficile studies and GI panel in the ED were all negative.  Hospitalist were called for admission  PAST MEDICAL HISTORY:   Past Medical History:  Diagnosis Date  . HTN (hypertension)      PAST SURGICAL HISTORY:   Past Surgical History:  Procedure Laterality Date  . CHOLECYSTECTOMY    . TONSILLECTOMY       SOCIAL HISTORY:   Social History   Tobacco Use  . Smoking status: Never Smoker  . Smokeless tobacco: Never Used  Substance Use Topics  . Alcohol use: No     FAMILY HISTORY:   Family History  Problem Relation Age of Onset  . Diabetes Mother      DRUG ALLERGIES:  No Known Allergies  MEDICATIONS AT HOME:   Prior to Admission medications   Medication Sig Start Date End Date Taking? Authorizing Provider  acetaminophen (TYLENOL) 325 MG tablet Take 650 mg by mouth every 6 (six) hours as needed.   Yes [provider]    REVIEW OF SYSTEMS:  Review of Systems  Constitutional: Negative for chills, fever, malaise/fatigue and weight loss.  HENT:  Negative for ear pain, hearing loss and tinnitus.   Eyes: Negative for blurred vision, double vision, pain and redness.  Respiratory: Negative for cough, hemoptysis and shortness of breath.   Cardiovascular: Negative for chest pain, palpitations, orthopnea and leg swelling.  Gastrointestinal: Positive for abdominal pain and diarrhea. Negative for constipation, nausea and vomiting.  Genitourinary: Negative for dysuria, frequency and hematuria.  Musculoskeletal: Negative for back pain, joint pain and neck pain.  Skin:       No acne, rash, or lesions  Neurological: Negative for dizziness, tremors, focal weakness and weakness.  Endo/Heme/Allergies: Negative for polydipsia. Does not bruise/bleed easily.  Psychiatric/Behavioral: Negative for depression. The patient is not nervous/anxious and does not have insomnia.      VITAL SIGNS:   Vitals:   07/19/19 1530 07/19/19 1630 07/19/19 2122 07/19/19 2125  BP: 128/85 111/75  (!) 172/92  Pulse: 86 81 89   Resp: 20 19 16  (!) 23  Temp:      TempSrc:      SpO2: 96% 96% 100%   Weight:      Height:       Wt Readings from Last 3 Encounters:  07/19/19 106.6 kg  06/18/15 106.1 kg    PHYSICAL EXAMINATION:  Physical Exam  Vitals reviewed. Constitutional: She is oriented to person, place, and time. She appears well-developed  and well-nourished. No distress.  HENT:  Head: Normocephalic and atraumatic.  Mouth/Throat: Oropharynx is clear and moist.  Eyes: Pupils are equal, round, and reactive to light. Conjunctivae and EOM are normal. No scleral icterus.  Neck: Normal range of motion. Neck supple. No JVD present. No thyromegaly present.  Cardiovascular: Normal rate, regular rhythm and intact distal pulses. Exam reveals no gallop and no friction rub.  No murmur heard. Respiratory: Effort normal and breath sounds normal. No respiratory distress. She has no wheezes. She has no rales.  GI: Soft. Bowel sounds are normal. She exhibits no distension. There  is abdominal tenderness.  Musculoskeletal: Normal range of motion.        General: No edema.     Comments: No arthritis, no gout  Lymphadenopathy:    She has no cervical adenopathy.  Neurological: She is alert and oriented to person, place, and time. No cranial nerve deficit.  No dysarthria, no aphasia  Skin: Skin is warm and dry. No rash noted. No erythema.  Psychiatric: She has a normal mood and affect. Her behavior is normal. Judgment and thought content normal.    LABORATORY PANEL:   CBC Recent Labs  Lab 07/19/19 1316  WBC 25.6*  HGB 12.5  HCT 39.2  PLT 254   ------------------------------------------------------------------------------------------------------------------  Chemistries  Recent Labs  Lab 07/19/19 1316  NA 135  K 2.9*  CL 105  CO2 21*  GLUCOSE 139*  BUN 17  CREATININE 1.64*  CALCIUM 8.5*  AST 23  ALT 23  ALKPHOS 65  BILITOT 1.3*   ------------------------------------------------------------------------------------------------------------------  Cardiac Enzymes No results for input(s): TROPONINI in the last 168 hours. ------------------------------------------------------------------------------------------------------------------  RADIOLOGY:  Ct Abdomen Pelvis W Contrast  Result Date: 07/19/2019 CLINICAL DATA:  Fever, abdominal pain, watery diarrhea, high WBC EXAM: CT ABDOMEN AND PELVIS WITH CONTRAST TECHNIQUE: Multidetector CT imaging of the abdomen and pelvis was performed using the standard protocol following bolus administration of intravenous contrast. CONTRAST:  179mL OMNIPAQUE IOHEXOL 300 MG/ML  SOLN COMPARISON:  06/07/2019 FINDINGS: Lower chest: Bibasilar bandlike scarring or atelectasis. Hepatobiliary: No focal liver abnormality is seen. Status post cholecystectomy. No biliary dilatation. Pancreas: Unremarkable. No pancreatic ductal dilatation or surrounding inflammatory changes. Spleen: Normal in size without significant abnormality.  Adrenals/Urinary Tract: Adrenal glands are unremarkable. Kidneys are normal, without renal calculi, solid lesion, or hydronephrosis. Bladder is unremarkable. Stomach/Bowel: Stomach is within normal limits. The appendix is normal. The colon is fluid-filled to the rectum. Vascular/Lymphatic: No significant vascular findings are present. No enlarged abdominal or pelvic lymph nodes. Reproductive: The right ovary is enlarged by multiple cysts or follicles and adjacent fat stranding in the right lower quadrant, retroperitoneum, and omentum measuring 5.6 x 4.7 x 4.4 cm (series 2, image 66). Other: No abdominal wall hernia or abnormality. No abdominopelvic ascites. Musculoskeletal: No acute or significant osseous findings. IMPRESSION: 1. The right ovary is enlarged by multiple cysts or follicles and adjacent fat stranding in the right lower quadrant, retroperitoneum, and omentum measuring 5.6 x 4.7 x 4.4 cm (series 2, image 66). Consider pelvic ultrasound to further evaluate for ovarian torsion if clinically referable signs and symptoms are present. Of note, the appendix is distinct from this finding in the right lower quadrant and normal in appearance. 2. The colon is fluid-filled to the rectum, in keeping with diarrheal illness. Electronically Signed   By: Eddie Candle M.D.   On: 07/19/2019 17:19   US Pelvic Doppler (torsion R/o Or Mass Arterial Flow)  Result Date: 07/19/2019 CLINICAL DATA:  RIGHT ovarian cyst seen on CT exam. EXAM: TRANSABDOMINAL AND TRANSVAGINAL ULTRASOUND OF PELVIS DOPPLER ULTRASOUND OF OVARIES TECHNIQUE: Both transabdominal and transvaginal ultrasound examinations of the pelvis were performed. Transabdominal technique was performed for global imaging of the pelvis including uterus, ovaries, adnexal regions, and pelvic cul-de-sac. It was necessary to proceed with endovaginal exam following the transabdominal exam to visualize the ovaries. Color and duplex Doppler ultrasound was utilized to evaluate  blood flow to the ovaries. COMPARISON:  CT 07/19/2019 FINDINGS: Uterus Measurements: 10.2 x 4.8 x 5.8 centimeters = volume: 151.6 mL. No fibroids or other mass visualized. Endometrium Thickness: 4.7 millimeters.  No focal abnormality visualized. Right ovary Measurements: 6.4 x 5.7 x 4.8 centimeters = volume: 92 mL. Internal characteristics of the ovary can not be discerned, given patient body habitus and limitations of the exam. Left ovary Measurements: 3.4 x 2.6 x 2.9 centimeters = volume: 13 mL. Normal appearance/no adnexal mass. Pulsed Doppler evaluation demonstrates normal low-resistance arterial and venous waveforms in both ovaries. Other: No free pelvic fluid. Study quality is degraded by patient body habitus. IMPRESSION: 1. RIGHT ovary is enlarged compared with the LEFT but detail is limited given patient body habitus. 2. Consider MRI versus follow-up pelvic ultrasound in 8-12 weeks. 3. No evidence for ovarian torsion. No fluid collections or ascites. Inflammatory change seen on recent CT exam is not demonstrated by ultrasound. Electronically Signed   By: Norva PavlovElizabeth  Brown M.D.   On: 07/19/2019 19:20   [place] Pelvic Complete With Transvaginal  Result Date: 07/19/2019 CLINICAL DATA:  RIGHT ovarian cyst seen on CT exam. EXAM: TRANSABDOMINAL AND TRANSVAGINAL ULTRASOUND OF PELVIS DOPPLER ULTRASOUND OF OVARIES TECHNIQUE: Both transabdominal and transvaginal ultrasound examinations of the pelvis were performed. Transabdominal technique was performed for global imaging of the pelvis including uterus, ovaries, adnexal regions, and pelvic cul-de-sac. It was necessary to proceed with endovaginal exam following the transabdominal exam to visualize the ovaries. Color and duplex Doppler ultrasound was utilized to evaluate blood flow to the ovaries. COMPARISON:  CT 07/19/2019 FINDINGS: Uterus Measurements: 10.2 x 4.8 x 5.8 centimeters = volume: 151.6 mL. No fibroids or other mass visualized. Endometrium Thickness: 4.7  millimeters.  No focal abnormality visualized. Right ovary Measurements: 6.4 x 5.7 x 4.8 centimeters = volume: 92 mL. Internal characteristics of the ovary can not be discerned, given patient body habitus and limitations of the exam. Left ovary Measurements: 3.4 x 2.6 x 2.9 centimeters = volume: 13 mL. Normal appearance/no adnexal mass. Pulsed Doppler evaluation demonstrates normal low-resistance arterial and venous waveforms in both ovaries. Other: No free pelvic fluid. Study quality is degraded by patient body habitus. IMPRESSION: 1. RIGHT ovary is enlarged compared with the LEFT but detail is limited given patient body habitus. 2. Consider MRI versus follow-up pelvic ultrasound in 8-12 weeks. 3. No evidence for ovarian torsion. No fluid collections or ascites. Inflammatory change seen on recent CT exam is not demonstrated by ultrasound. Electronically Signed   By: Norva PavlovElizabeth  Brown M.D.   On: 07/19/2019 19:20    EKG:   Orders placed or performed in visit on 06/18/15  . EKG 12-Lead    IMPRESSION AND PLAN:  Principal Problem:   Sepsis (HCC) -due to UTI.  IV antibiotics given.  Lactic acid ordered, blood cultures ordered.  Blood pressure stable. Active Problems:   UTI (urinary tract infection) -IV antibiotics as above, urine culture sent.   HTN (hypertension) -continue home dose antihypertensives  Of note, patient requested to be tested for various STDs.  Orders placed.  Chart review performed and case discussed with ED provider. Labs, imaging and/or ECG reviewed by provider and discussed with patient/family. Management plans discussed with the patient and/or family.  COVID-19 status: Tested negative     DVT PROPHYLAXIS: SubQ lovenox   GI PROPHYLAXIS:  None  ADMISSION STATUS: Inpatient     CODE STATUS: Full  TOTAL TIME TAKING CARE OF THIS PATIENT: 45 minutes.   This patient was evaluated in the context of the global COVID-19 pandemic, which necessitated consideration that the patient  might be at risk for infection with the SARS-CoV-2 virus that causes COVID-19. Institutional protocols and algorithms that pertain to the evaluation of patients at risk for COVID-19 are in a state of rapid change based on information released by regulatory bodies including the CDC and federal and state organizations. These policies and algorithms were followed to the best of this provider's knowledge to date during the patient's care at this facility.  Barney DrainDavid F Kacin Dancy 07/19/2019, 10:37 PM  [facility]  Office  [phone]  CC: Primary care physician; Patient, No Pcp Per  Note:  This document was prepared using Dragon voice recognition software and may include unintentional dictation errors.

## 2019-07-19 NOTE — ED Notes (Signed)
Pt cleared by MD to eat and drink. Pt given sandwich tray, peanut butter, crackers, and ice water at this time

## 2019-07-19 NOTE — ED Provider Notes (Addendum)
[facility] Emergency Department Provider Note  ____________________________________________   I have reviewed the triage vital signs and the nursing notes. Where available I have reviewed prior notes and, if possible and indicated, outside hospital notes.   Patient seen and evaluated during the coronavirus epidemic during a time with low staffing  Patient seen for the symptoms described in the history of present illness. She was evaluated in the context of the global COVID-19 pandemic, which necessitated consideration that the patient might be at risk for infection with the SARS-CoV-2 virus that causes COVID-19. Institutional protocols and algorithms that pertain to the evaluation of patients at risk for COVID-19 are in a state of rapid change based on information released by regulatory bodies including the CDC and federal and state organizations. These policies and algorithms were followed during the patient's care in the ED.    HISTORY  Chief Complaint Abdominal Pain and Diarrhea    HPI Kristie Santos is a 43 y.o. female who has had recurrent diarrhea since last month.  Was placed on "antibiotics" of some kind and seems seem to get a little better for a while but the diarrhea never completely went away and then last Thursday the diarrhea came back more pronouncedly.  She does not know of any diagnosis she is beginning.  She was the recipient of a CT scan the first time it happened.  She states she has had fevers off and on.  However mostly she was improved except for continuing diarrhea until Thursday.  Then she began having worsening diarrhea.  Denies melena or bright red blood per rectum.  Watery diarrhea.  No vomiting.  Diffuse abdominal discomfort more on the right than the left.  No other antibiotics besides what she was given for this in the first place and no recent camping no recent travel  Status post cholecystectomy, no other prior surgeries of relevance.   URI symptoms cold or cough symptoms no known exposure to COVID-19   History reviewed. No pertinent past medical history.  There are no active problems to display for this patient.   Past Surgical History:  Procedure Laterality Date  . CHOLECYSTECTOMY    . TONSILLECTOMY      Prior to Admission medications   Medication Sig Start Date End Date Taking? Authorizing Provider  ibuprofen (ADVIL) 800 MG tablet Take 1 tablet (800 mg total) by mouth 3 (three) times daily. 06/07/19   Alveria Apley, PA-C    Allergies Patient has no known allergies.  No family history on file.  Social History Social History   Tobacco Use  . Smoking status: Never Smoker  . Smokeless tobacco: Never Used  Substance Use Topics  . Alcohol use: No  . Drug use: No    Review of Systems Constitutional: Positive fever Eyes: No visual changes. ENT: No sore throat. No stiff neck no neck pain Cardiovascular: Denies chest pain. Respiratory: Denies shortness of breath. Gastrointestinal:   no vomiting.  + diarrhea.  No constipation. Genitourinary: Negative for dysuria. Musculoskeletal: Negative lower extremity swelling Skin: Negative for rash. Neurological: Negative for severe headaches, focal weakness or numbness.   ____________________________________________   PHYSICAL EXAM:  VITAL SIGNS: ED Triage Vitals  Enc Vitals Group     BP 07/19/19 1246 103/71     Pulse Rate 07/19/19 1246 (!) 112     Resp 07/19/19 1246 20     Temp 07/19/19 1246 (!) 101.9 F (38.8 C)     Temp Source 07/19/19 1246 Oral  SpO2 07/19/19 1246 98 %     Weight 07/19/19 1246 235 lb (106.6 kg)     Height 07/19/19 1246 5\' 1"  (1.549 m)     Head Circumference --      Peak Flow --      Pain Score 07/19/19 1252 8     Pain Loc --      Pain Edu? --      Excl. in GC? --     Constitutional: Alert and oriented. Well appearing and in no acute distress. Eyes: Conjunctivae are normal Head: Atraumatic HEENT: No  congestion/rhinnorhea. Mucous membranes are lightly dry.  Oropharynx non-erythematous Neck:   Nontender with no meningismus, no masses, no stridor Cardiovascular: Slightly tachycardic rate, regular rhythm. Grossly normal heart sounds.  Good peripheral circulation. Respiratory: Normal respiratory effort.  No retractions. Lungs CTAB. Abdominal: Soft and diffuse tenderness, more in the right upper than anywhere. No distention. No guarding no rebound Back:  There is no focal tenderness or step off.  there is no midline tenderness there are no lesions noted. there is no CVA tenderness Musculoskeletal: No lower extremity tenderness, no upper extremity tenderness. No joint effusions, no DVT signs strong distal pulses no edema Neurologic:  Normal speech and language. No gross focal neurologic deficits are appreciated.  Skin:  Skin is warm, dry and intact. No rash noted. Psychiatric: Mood and affect are normal. Speech and behavior are normal.  ____________________________________________   LABS (all labs ordered are listed, but only abnormal results are displayed)  Labs Reviewed  COMPREHENSIVE METABOLIC PANEL - Abnormal; Notable for the following components:      Result Value   Potassium 2.9 (*)    CO2 21 (*)    Glucose, Bld 139 (*)    Creatinine, Ser 1.64 (*)    Calcium 8.5 (*)    Total Bilirubin 1.3 (*)    GFR calc non Af Amer 38 (*)    GFR calc Af Amer 44 (*)    All other components within normal limits  CBC - Abnormal; Notable for the following components:   WBC 25.6 (*)    MCH 25.6 (*)    RDW 16.5 (*)    All other components within normal limits  URINALYSIS, COMPLETE (UACMP) WITH MICROSCOPIC - Abnormal; Notable for the following components:   Color, Urine AMBER (*)    APPearance TURBID (*)    Specific Gravity, Urine 1.032 (*)    Hgb urine dipstick LARGE (*)    Bilirubin Urine SMALL (*)    Ketones, ur 5 (*)    Protein, ur 100 (*)    Leukocytes,Ua SMALL (*)    WBC, UA >50 (*)     Bacteria, UA FEW (*)    All other components within normal limits  SARS CORONAVIRUS 2 (HOSPITAL ORDER, PERFORMED IN [facility])  LIPASE, BLOOD  POC URINE PREG, ED  POCT PREGNANCY, URINE    Pertinent labs  results that were available during my care of the patient were reviewed by me and considered in my medical decision making (see chart for details). ____________________________________________  EKG  I personally interpreted any EKGs ordered by me or triage  ____________________________________________  RADIOLOGY  Pertinent labs & imaging results that were available during my care of the patient were reviewed by me and considered in my medical decision making (see chart for details). If possible, patient and/or family made aware of any abnormal findings.  No results found. ____________________________________________    PROCEDURES  Procedure(s) performed: None  Procedures  Critical Care performed: None  ____________________________________________   INITIAL IMPRESSION / ASSESSMENT AND PLAN / ED COURSE  Pertinent labs & imaging results that were available during my care of the patient were reviewed by me and considered in my medical decision making (see chart for details).   With fever and diarrhea differential is broad includes viral etiologies including but not limited to the COVID or norovirus, current nature of the diarrhea makes 1 suspect the possibility of C. difficile especially given that she did have some antibiotics during the course of this treatment and her white count is elevated.  She does have discomfort in the abdomen, more on the right than the left, but not right lower quadrant.  Specifically.  We will obtain imaging given white count and reassess     ----------------------------------------- 4:18 PM on 07/19/2019 -----------------------------------------  Signed out at the end of my shift to dr.  Marisa Severinsiadecki ____________________________________________   FINAL CLINICAL IMPRESSION(S) / ED DIAGNOSES  Final diagnoses:  None      This chart was dictated using voice recognition software.  Despite best efforts to proofread,  errors can occur which can change meaning.      Jeanmarie PlantMcShane, Tracia Lacomb A, MD 07/19/19 1415    Jeanmarie PlantMcShane, Yina Riviere A, MD 07/19/19 1431    Jeanmarie PlantMcShane, Kron Everton A, MD 07/19/19 [phone]

## 2019-07-19 NOTE — ED Notes (Signed)
ED TO INPATIENT HANDOFF REPORT  ED Nurse Name and Phone #:  Mishael Krysiak [phone]  S Name/Age/Gender Kristie Santos 43 y.o. female Room/Bed: ED24A/ED24A  Code Status   Code Status: Not on file  Home/SNF/Other Home Patient oriented x 4 Is this baseline? home  Triage Complete: Triage complete  Chief Complaint diarrhea, abd pain  Triage Note Pt arrived via POV with reports of abdominal pain and diarrhea since yesterday.  Pt reports diarrhea is watery.   Allergies No Known Allergies  Level of Care/Admitting Diagnosis ED Disposition    ED Disposition Condition Comment   Admit  Hospital Area: [facility] [[id number]]  Level of Care: Med-Surg [16]  Covid Evaluation: Confirmed COVID Negative  Diagnosis: Sepsis [facility](HCC) [[id number]]) [[id number]]  Admitting Physician: Oralia ManisWILLIS, DAVID [[id number]]  Attending Physician: Oralia ManisWILLIS, DAVID [phone][[id number]]  Estimated length of stay: past midnight tomorrow  Certification:: I certify this patient will need inpatient services for at least 2 midnights  PT Class (Do Not Modify): Inpatient [101]  PT Acc Code (Do Not Modify): Private [1]       B Medical/Surgery History Past Medical History:  Diagnosis Date  . HTN (hypertension)    Past Surgical History:  Procedure Laterality Date  . CHOLECYSTECTOMY    . TONSILLECTOMY       A IV Location/Drains/Wounds Patient Lines/Drains/Airways Status   Active Line/Drains/Airways    Name:   Placement date:   Placement time:   Site:   Days:   Peripheral IV 07/19/19 Left Forearm   07/19/19    1332    Forearm   less than 1          Intake/Output Last 24 hours  Intake/Output Summary (Last 24 hours) at 07/19/2019 2309 Last data filed at 07/19/2019 1814 Gross per 24 hour  Intake 1000 ml  Output -  Net 1000 ml    Labs/Imaging Results for orders placed or performed during the hospital encounter of 07/19/19 (from the past 48 hour(s))  Lipase, blood     Status: None   Collection Time: 07/19/19  1:16 PM   Result Value Ref Range   Lipase 29 11 - 51 U/L    Comment: Performed at [facility], [address] [place], [place] [address]  Comprehensive metabolic panel     Status: Abnormal   Collection Time: 07/19/19  1:16 PM  Result Value Ref Range   Sodium 135 135 - 145 mmol/L   Potassium 2.9 (L) 3.5 - 5.1 mmol/L   Chloride 105 98 - 111 mmol/L   CO2 21 (L) 22 - 32 mmol/L   Glucose, Bld 139 (H) 70 - 99 mg/dL   BUN 17 6 - 20 mg/dL   Creatinine, Ser 5.781.64 (H) 0.44 - 1.00 mg/dL   Calcium 8.5 (L) 8.9 - 10.3 mg/dL   Total Protein 7.8 6.5 - 8.1 g/dL   Albumin 3.5 3.5 - 5.0 g/dL   AST 23 15 - 41 U/L   ALT 23 0 - 44 U/L   Alkaline Phosphatase 65 38 - 126 U/L   Total Bilirubin 1.3 (H) 0.3 - 1.2 mg/dL   GFR calc non Af Amer 38 (L) >60 mL/min   GFR calc Af Amer 44 (L) >60 mL/min   Anion gap 9 5 - 15    Comment: Performed at [facility], [address] [place], [place] [address]  CBC     Status: Abnormal   Collection Time: 07/19/19  1:16 PM  Result Value Ref Range   WBC 25.6 (  H) 4.0 - 10.5 K/uL   RBC 4.89 3.87 - 5.11 MIL/uL   Hemoglobin 12.5 12.0 - 15.0 g/dL   HCT 16.139.2 09.636.0 - 04.546.0 %   MCV 80.2 80.0 - 100.0 fL   MCH 25.6 (L) 26.0 - 34.0 pg   MCHC 31.9 30.0 - 36.0 g/dL   RDW 40.916.5 (H) 81.111.5 - 91.415.5 %   Platelets 254 150 - 400 K/uL   nRBC 0.0 0.0 - 0.2 %    Comment: Performed at [facility], [address] [place], [place] [address]  Urinalysis, Complete w Microscopic     Status: Abnormal   Collection Time: 07/19/19  1:16 PM  Result Value Ref Range   Color, Urine AMBER (A) YELLOW    Comment: BIOCHEMICALS MAY BE AFFECTED BY COLOR   APPearance TURBID (A) CLEAR   Specific Gravity, Urine 1.032 (H) 1.005 - 1.030   pH 5.0 5.0 - 8.0   Glucose, UA NEGATIVE NEGATIVE mg/dL   Hgb urine dipstick LARGE (A) NEGATIVE   Bilirubin Urine SMALL (A) NEGATIVE   Ketones, ur 5 (A) NEGATIVE mg/dL   Protein, ur 621100 (A) NEGATIVE mg/dL   Nitrite NEGATIVE NEGATIVE   Leukocytes,Ua  SMALL (A) NEGATIVE   RBC / HPF 21-50 0 - 5 RBC/hpf   WBC, UA >50 (H) 0 - 5 WBC/hpf   Bacteria, UA FEW (A) NONE SEEN   Squamous Epithelial / LPF 21-50 0 - 5   WBC Clumps PRESENT    Mucus PRESENT     Comment: Performed at [facility], [address] [place], [place] [address]  SARS Coronavirus 2 (CEPHEID - Performed in [facility] hospital lab), Hosp Order     Status: None   Collection Time: 07/19/19  1:16 PM   Specimen: Nasopharyngeal Swab  Result Value Ref Range   SARS Coronavirus 2 NEGATIVE NEGATIVE    Comment: (NOTE) If result is NEGATIVE SARS-CoV-2 target nucleic acids are NOT DETECTED. The SARS-CoV-2 RNA is generally detectable in upper and lower  respiratory specimens during the acute phase of infection. The lowest  concentration of SARS-CoV-2 viral copies this assay can detect is 250  copies / mL. A negative result does not preclude SARS-CoV-2 infection  and should not be used as the sole basis for treatment or other  patient management decisions.  A negative result may occur with  improper specimen collection / handling, submission of specimen other  than nasopharyngeal swab, presence of viral mutation(s) within the  areas targeted by this assay, and inadequate number of viral copies  (<250 copies / mL). A negative result must be combined with clinical  observations, patient history, and epidemiological information. If result is POSITIVE SARS-CoV-2 target nucleic acids are DETECTED. The SARS-CoV-2 RNA is generally detectable in upper and lower  respiratory specimens dur ing the acute phase of infection.  Positive  results are indicative of active infection with SARS-CoV-2.  Clinical  correlation with patient history and other diagnostic information is  necessary to determine patient infection status.  Positive results do  not rule out bacterial infection or co-infection with other viruses. If result is PRESUMPTIVE POSTIVE SARS-CoV-2 nucleic acids MAY BE  PRESENT.   A presumptive positive result was obtained on the submitted specimen  and confirmed on repeat testing.  While 2019 novel coronavirus  (SARS-CoV-2) nucleic acids may be present in the submitted sample  additional confirmatory testing may be necessary for epidemiological  and / or clinical management purposes  to differentiate between  SARS-CoV-2  and other Sarbecovirus currently known to infect humans.  If clinically indicated additional testing with an alternate test  methodology [phone]([id number]) is advised. The SARS-CoV-2 RNA is generally  detectable in upper and lower respiratory sp ecimens during the acute  phase of infection. The expected result is Negative. Fact Sheet for Patients:  BoilerBrush.com.cyhttps://www.fda.gov/media/136312/download Fact Sheet for Healthcare Providers: https://pope.com/https://www.fda.gov/media/136313/download This test is not yet approved or cleared by the [place] FDA and has been authorized for detection and/or diagnosis of SARS-CoV-2 by FDA under an Emergency Use Authorization (EUA).  This EUA will remain in effect (meaning this test can be used) for the duration of the COVID-19 declaration under Section 564(b)(1) of the Act, 21 U.S.C. section 360bbb-3(b)(1), unless the authorization is terminated or revoked sooner. Performed at [facility], [address] [place], [place] [address]   Pregnancy, urine POC     Status: None   Collection Time: 07/19/19  1:44 PM  Result Value Ref Range   Preg Test, Ur NEGATIVE NEGATIVE    Comment:        THE SENSITIVITY OF THIS METHODOLOGY IS >24 mIU/mL   Gastrointestinal Panel by PCR , Stool     Status: None   Collection Time: 07/19/19  2:28 PM   Specimen: Stool  Result Value Ref Range   Campylobacter species NOT DETECTED NOT DETECTED   Plesimonas shigelloides NOT DETECTED NOT DETECTED   Salmonella species NOT DETECTED NOT DETECTED   Yersinia enterocolitica NOT DETECTED NOT DETECTED   Vibrio species NOT DETECTED NOT DETECTED    Vibrio cholerae NOT DETECTED NOT DETECTED   Enteroaggregative E coli (EAEC) NOT DETECTED NOT DETECTED   Enteropathogenic E coli (EPEC) NOT DETECTED NOT DETECTED   Enterotoxigenic E coli (ETEC) NOT DETECTED NOT DETECTED   Shiga like toxin producing E coli (STEC) NOT DETECTED NOT DETECTED   Shigella/Enteroinvasive E coli (EIEC) NOT DETECTED NOT DETECTED   Cryptosporidium NOT DETECTED NOT DETECTED   Cyclospora cayetanensis NOT DETECTED NOT DETECTED   Entamoeba histolytica NOT DETECTED NOT DETECTED   Giardia lamblia NOT DETECTED NOT DETECTED   Adenovirus F40/41 NOT DETECTED NOT DETECTED   Astrovirus NOT DETECTED NOT DETECTED   Norovirus GI/GII NOT DETECTED NOT DETECTED   Rotavirus A NOT DETECTED NOT DETECTED   Sapovirus (I, II, IV, and V) NOT DETECTED NOT DETECTED    Comment: Performed at [facility], [address] [place], [place] [address]  C difficile quick scan w PCR reflex     Status: None   Collection Time: 07/19/19  2:28 PM   Specimen: Stool  Result Value Ref Range   C Diff antigen NEGATIVE NEGATIVE   C Diff toxin NEGATIVE NEGATIVE   C Diff interpretation No C. difficile detected.     Comment: Performed at [facility], [address] [place], [place] [address]   Ct Abdomen Pelvis W Contrast  Result Date: 07/19/2019 CLINICAL DATA:  Fever, abdominal pain, watery diarrhea, high WBC EXAM: CT ABDOMEN AND PELVIS WITH CONTRAST TECHNIQUE: Multidetector CT imaging of the abdomen and pelvis was performed using the standard protocol following bolus administration of intravenous contrast. CONTRAST:  100mL OMNIPAQUE IOHEXOL 300 MG/ML  SOLN COMPARISON:  06/07/2019 FINDINGS: Lower chest: Bibasilar bandlike scarring or atelectasis. Hepatobiliary: No focal liver abnormality is seen. Status post cholecystectomy. No biliary dilatation. Pancreas: Unremarkable. No pancreatic ductal dilatation or surrounding inflammatory changes. Spleen: Normal in size without significant  abnormality. Adrenals/Urinary Tract: Adrenal glands are unremarkable. Kidneys are normal, without renal calculi, solid lesion,  or hydronephrosis. Bladder is unremarkable. Stomach/Bowel: Stomach is within normal limits. The appendix is normal. The colon is fluid-filled to the rectum. Vascular/Lymphatic: No significant vascular findings are present. No enlarged abdominal or pelvic lymph nodes. Reproductive: The right ovary is enlarged by multiple cysts or follicles and adjacent fat stranding in the right lower quadrant, retroperitoneum, and omentum measuring 5.6 x 4.7 x 4.4 cm (series 2, image 66). Other: No abdominal wall hernia or abnormality. No abdominopelvic ascites. Musculoskeletal: No acute or significant osseous findings. IMPRESSION: 1. The right ovary is enlarged by multiple cysts or follicles and adjacent fat stranding in the right lower quadrant, retroperitoneum, and omentum measuring 5.6 x 4.7 x 4.4 cm (series 2, image 66). Consider pelvic ultrasound to further evaluate for ovarian torsion if clinically referable signs and symptoms are present. Of note, the appendix is distinct from this finding in the right lower quadrant and normal in appearance. 2. The colon is fluid-filled to the rectum, in keeping with diarrheal illness. Electronically Signed   By: Lauralyn Primes M.D.   On: 07/19/2019 17:19   US Pelvic Doppler (torsion R/o Or Mass Arterial Flow)  Result Date: 07/19/2019 CLINICAL DATA:  RIGHT ovarian cyst seen on CT exam. EXAM: TRANSABDOMINAL AND TRANSVAGINAL ULTRASOUND OF PELVIS DOPPLER ULTRASOUND OF OVARIES TECHNIQUE: Both transabdominal and transvaginal ultrasound examinations of the pelvis were performed. Transabdominal technique was performed for global imaging of the pelvis including uterus, ovaries, adnexal regions, and pelvic cul-de-sac. It was necessary to proceed with endovaginal exam following the transabdominal exam to visualize the ovaries. Color and duplex Doppler ultrasound was  utilized to evaluate blood flow to the ovaries. COMPARISON:  CT 07/19/2019 FINDINGS: Uterus Measurements: 10.2 x 4.8 x 5.8 centimeters = volume: 151.6 mL. No fibroids or other mass visualized. Endometrium Thickness: 4.7 millimeters.  No focal abnormality visualized. Right ovary Measurements: 6.4 x 5.7 x 4.8 centimeters = volume: 92 mL. Internal characteristics of the ovary can not be discerned, given patient body habitus and limitations of the exam. Left ovary Measurements: 3.4 x 2.6 x 2.9 centimeters = volume: 13 mL. Normal appearance/no adnexal mass. Pulsed Doppler evaluation demonstrates normal low-resistance arterial and venous waveforms in both ovaries. Other: No free pelvic fluid. Study quality is degraded by patient body habitus. IMPRESSION: 1. RIGHT ovary is enlarged compared with the LEFT but detail is limited given patient body habitus. 2. Consider MRI versus follow-up pelvic ultrasound in 8-12 weeks. 3. No evidence for ovarian torsion. No fluid collections or ascites. Inflammatory change seen on recent CT exam is not demonstrated by ultrasound. Electronically Signed   By: Norva Pavlov M.D.   On: 07/19/2019 19:20   US Pelvic Complete With Transvaginal  Result Date: 07/19/2019 CLINICAL DATA:  RIGHT ovarian cyst seen on CT exam. EXAM: TRANSABDOMINAL AND TRANSVAGINAL ULTRASOUND OF PELVIS DOPPLER ULTRASOUND OF OVARIES TECHNIQUE: Both transabdominal and transvaginal ultrasound examinations of the pelvis were performed. Transabdominal technique was performed for global imaging of the pelvis including uterus, ovaries, adnexal regions, and pelvic cul-de-sac. It was necessary to proceed with endovaginal exam following the transabdominal exam to visualize the ovaries. Color and duplex Doppler ultrasound was utilized to evaluate blood flow to the ovaries. COMPARISON:  CT 07/19/2019 FINDINGS: Uterus Measurements: 10.2 x 4.8 x 5.8 centimeters = volume: 151.6 mL. No fibroids or other mass visualized. Endometrium  Thickness: 4.7 millimeters.  No focal abnormality visualized. Right ovary Measurements: 6.4 x 5.7 x 4.8 centimeters = volume: 92 mL. Internal characteristics of the ovary can not be discerned, given  patient body habitus and limitations of the exam. Left ovary Measurements: 3.4 x 2.6 x 2.9 centimeters = volume: 13 mL. Normal appearance/no adnexal mass. Pulsed Doppler evaluation demonstrates normal low-resistance arterial and venous waveforms in both ovaries. Other: No free pelvic fluid. Study quality is degraded by patient body habitus. IMPRESSION: 1. RIGHT ovary is enlarged compared with the LEFT but detail is limited given patient body habitus. 2. Consider MRI versus follow-up pelvic ultrasound in 8-12 weeks. 3. No evidence for ovarian torsion. No fluid collections or ascites. Inflammatory change seen on recent CT exam is not demonstrated by ultrasound. Electronically Signed   By: Nolon Nations M.D.   On: 07/19/2019 19:20    Pending Labs Unresulted Labs (From admission, onward)    Start     Ordered   07/19/19 2245  Urine Culture  Add-on,   AD     07/19/19 2244   Signed and Held  HIV antibody (Routine Testing)  Once,   R     Signed and Held   Signed and Held  CBC  (enoxaparin (LOVENOX)    CrCl >/= 30 ml/min)  Once,   R    Comments: Baseline for enoxaparin therapy IF NOT ALREADY DRAWN.  Notify MD if PLT < 100 K.    Signed and Held   Signed and Held  Creatinine, serum  (enoxaparin (LOVENOX)    CrCl >/= 30 ml/min)  Once,   R    Comments: Baseline for enoxaparin therapy IF NOT ALREADY DRAWN.    Signed and Held   Signed and Held  Creatinine, serum  (enoxaparin (LOVENOX)    CrCl >/= 30 ml/min)  Weekly,   R    Comments: while on enoxaparin therapy    Signed and Held   Signed and Held  Basic metabolic panel  Tomorrow morning,   R     Signed and Held   Signed and Held  CBC  Tomorrow morning,   R     Signed and Held          Vitals/Pain Today's Vitals   07/19/19 1530 07/19/19 1630 07/19/19  2122 07/19/19 2125  BP: 128/85 111/75  (!) 172/92  Pulse: 86 81 89   Resp: 20 19 16  (!) 23  Temp:      TempSrc:      SpO2: 96% 96% 100%   Weight:      Height:      PainSc:        Isolation Precautions Enteric precautions (UV disinfection)  Medications Medications  acetaminophen (TYLENOL) tablet 650 mg (650 mg Oral Given 07/19/19 1336)  sodium chloride 0.9 % bolus 1,000 mL (0 mLs Intravenous Stopped 07/19/19 1814)  potassium chloride SA (K-DUR) CR tablet 40 mEq (40 mEq Oral Given 07/19/19 1423)  sodium chloride 0.9 % bolus 500 mL (500 mLs Intravenous Bolus 07/19/19 1425)  iohexol (OMNIPAQUE) 300 MG/ML solution 30 mL (30 mLs Oral Contrast Given 07/19/19 1436)  iohexol (OMNIPAQUE) 300 MG/ML solution 100 mL (100 mLs Intravenous Contrast Given 07/19/19 1659)  ciprofloxacin (CIPRO) IVPB 400 mg (400 mg Intravenous New Bag/Given 07/19/19 2056)  metroNIDAZOLE (FLAGYL) IVPB 500 mg (500 mg Intravenous New Bag/Given 07/19/19 2055)    Mobility Low fall risk   Focused Assessments    R Recommendations: See Admitting Provider Note  Report given to:   Additional Notes:

## 2019-07-19 NOTE — ED Triage Notes (Signed)
Pt arrived via POV with reports of abdominal pain and diarrhea since yesterday.  Pt reports diarrhea is watery.

## 2019-07-19 NOTE — ED Provider Notes (Signed)
-----------------------------------------   8:52 PM on 07/19/2019 -----------------------------------------  I took over care on this patient from Dr. Burlene Arnt.  The patient presented with diarrhea which was recurrent after a course of outpatient antibiotics (the patient believes it was amoxicillin with some other ingredient, so likely Augmentin).  CT abdomen showed no significant colitis but did show some enlargement with cystic findings and surrounding stranding of the right ovary.  The urinalysis also shows findings consistent with a UTI, although it is possible this could be contaminant.  GI panel and C. difficile study were both negative.  Based on the CT finding I obtained a pelvic ultrasound.  It showed enlargement of the right ovary but no other acute findings and no evidence of torsion.  On reassessment, the patient appears comfortable although she does endorse some right-sided pain.  Her vital signs have improved after fluids.  I consulted Dr. Kenton Kingfisher from OB/GYN.  I discussed the clinical presentation and the results of the imaging with him.  He advised that this was unlikely to be the cause of the patient's fever and elevated WBC count and that she likely could follow-up as an outpatient for cyst work-up.  However he advised that he could be reconsulted if the patient was being admitted and needed further gynecologic evaluation in the hospital.  Given the elevated WBC count, fever, and the fact that the patient was previously on another antibiotic, as well as the uncertainty around the exact cause of her infection (whether it is primarily colitis which is just not showing up on the CT, or UTI) I will admit her for IV antibiotics, fluids, and further work-up.  I signed the patient out to the hospitalist Dr. Jannifer Franklin.   Arta Silence, MD 07/19/19 2056

## 2019-07-20 LAB — CBC
HCT: 36.5 % (ref 36.0–46.0)
Hemoglobin: 11.6 g/dL — ABNORMAL LOW (ref 12.0–15.0)
MCH: 25.1 pg — ABNORMAL LOW (ref 26.0–34.0)
MCHC: 31.8 g/dL (ref 30.0–36.0)
MCV: 78.8 fL — ABNORMAL LOW (ref 80.0–100.0)
Platelets: 232 10*3/uL (ref 150–400)
RBC: 4.63 MIL/uL (ref 3.87–5.11)
RDW: 16.8 % — ABNORMAL HIGH (ref 11.5–15.5)
WBC: 19.1 10*3/uL — ABNORMAL HIGH (ref 4.0–10.5)
nRBC: 0 % (ref 0.0–0.2)

## 2019-07-20 LAB — BASIC METABOLIC PANEL
Anion gap: 9 (ref 5–15)
BUN: 15 mg/dL (ref 6–20)
CO2: 17 mmol/L — ABNORMAL LOW (ref 22–32)
Calcium: 8.1 mg/dL — ABNORMAL LOW (ref 8.9–10.3)
Chloride: 109 mmol/L (ref 98–111)
Creatinine, Ser: 1.27 mg/dL — ABNORMAL HIGH (ref 0.44–1.00)
GFR calc Af Amer: >60 mL/min (ref >60)
GFR calc non Af Amer: 52 mL/min — ABNORMAL LOW (ref >60)
Glucose, Bld: 150 mg/dL — ABNORMAL HIGH (ref 70–99)
Potassium: 2.9 mmol/L — ABNORMAL LOW (ref 3.5–5.1)
Sodium: 135 mmol/L (ref 135–145)

## 2019-07-20 LAB — MAGNESIUM: Magnesium: 1.7 mg/dL (ref 1.7–2.4)

## 2019-07-20 LAB — LACTIC ACID, PLASMA: Lactic Acid, Venous: 2.2 mmol/L (ref 0.5–1.9)

## 2019-07-20 MED ORDER — SODIUM CHLORIDE 0.9 % IV SOLN
INTRAVENOUS | Status: AC
  Administered 2019-07-20: 15:00:00 via INTRAVENOUS

## 2019-07-20 MED ORDER — MAGNESIUM SULFATE 2 GM/50ML IV SOLN
2.0000 g | Freq: Once | INTRAVENOUS | Status: AC
  Administered 2019-07-21: 2 g via INTRAVENOUS
  Filled 2019-07-20: qty 50

## 2019-07-20 MED ORDER — CIPROFLOXACIN IN D5W 400 MG/200ML IV SOLN
400.0000 mg | Freq: Two times a day (BID) | INTRAVENOUS | Status: DC
  Administered 2019-07-20 – 2019-07-21 (×3): 400 mg via INTRAVENOUS
  Filled 2019-07-20 (×5): qty 200

## 2019-07-20 MED ORDER — ALUM & MAG HYDROXIDE-SIMETH 200-200-20 MG/5ML PO SUSP
15.0000 mL | Freq: Four times a day (QID) | ORAL | Status: DC | PRN
  Administered 2019-07-21: 15 mL via ORAL
  Filled 2019-07-20: qty 30

## 2019-07-20 MED ORDER — SODIUM CHLORIDE 0.9 % IV SOLN
INTRAVENOUS | Status: AC
  Administered 2019-07-20: 02:00:00 via INTRAVENOUS

## 2019-07-20 MED ORDER — ACETAMINOPHEN 325 MG PO TABS
650.0000 mg | ORAL_TABLET | Freq: Four times a day (QID) | ORAL | Status: DC | PRN
  Administered 2019-07-20: 650 mg via ORAL
  Filled 2019-07-20: qty 2

## 2019-07-20 MED ORDER — ACETAMINOPHEN 650 MG RE SUPP: 650.0000 mg | Freq: Four times a day (QID) | RECTAL | Status: DC | PRN

## 2019-07-20 MED ORDER — ONDANSETRON HCL 4 MG PO TABS: 4.0000 mg | ORAL_TABLET | Freq: Four times a day (QID) | ORAL | Status: DC | PRN

## 2019-07-20 MED ORDER — POTASSIUM CHLORIDE CRYS ER 20 MEQ PO TBCR
40.0000 meq | EXTENDED_RELEASE_TABLET | Freq: Once | ORAL | Status: AC
  Administered 2019-07-20: 40 meq via ORAL
  Filled 2019-07-20: qty 2

## 2019-07-20 MED ORDER — ONDANSETRON HCL 4 MG/2ML IJ SOLN
4.0000 mg | Freq: Four times a day (QID) | INTRAMUSCULAR | Status: DC | PRN
  Administered 2019-07-24: 4 mg via INTRAVENOUS

## 2019-07-20 MED ORDER — LOPERAMIDE HCL 2 MG PO CAPS
2.0000 mg | ORAL_CAPSULE | Freq: Four times a day (QID) | ORAL | Status: DC | PRN
  Filled 2019-07-20: qty 1

## 2019-07-20 MED ORDER — ENOXAPARIN SODIUM 40 MG/0.4ML ~~LOC~~ SOLN
40.0000 mg | Freq: Two times a day (BID) | SUBCUTANEOUS | Status: DC
  Administered 2019-07-20 – 2019-07-27 (×13): 40 mg via SUBCUTANEOUS
  Filled 2019-07-20 (×14): qty 0.4

## 2019-07-20 MED ORDER — IBUPROFEN 800 MG PO TABS
400.0000 mg | ORAL_TABLET | Freq: Four times a day (QID) | ORAL | Status: DC | PRN
  Administered 2019-07-20: 400 mg via ORAL
  Filled 2019-07-20: qty 1

## 2019-07-20 MED ORDER — METRONIDAZOLE IN NACL 5-0.79 MG/ML-% IV SOLN
500.0000 mg | Freq: Three times a day (TID) | INTRAVENOUS | Status: DC
  Administered 2019-07-20 (×3): 500 mg via INTRAVENOUS
  Filled 2019-07-20 (×6): qty 100

## 2019-07-20 MED ORDER — POTASSIUM CHLORIDE 10 MEQ/100ML IV SOLN
10.0000 meq | INTRAVENOUS | Status: AC
  Administered 2019-07-20 (×4): 10 meq via INTRAVENOUS
  Filled 2019-07-20 (×2): qty 100

## 2019-07-20 NOTE — Plan of Care (Signed)

## 2019-07-20 NOTE — Progress Notes (Signed)
[employer] - [place] at [facility]   PATIENT NAME: Kristie DresserKristy Santos    MR#:  [id number]  DATE OF BIRTH:  03/24/1976  SUBJECTIVE:  CHIEF COMPLAINT:   Chief Complaint  Patient presents with  . Abdominal Pain  . Diarrhea   The patient still has fever 103 this morning.  No abdominal pain, nausea vomiting or diarrhea. REVIEW OF SYSTEMS:  Review of Systems  Constitutional: Positive for chills and fever. Negative for malaise/fatigue.  HENT: Negative for sore throat.   Eyes: Negative for blurred vision and double vision.  Respiratory: Negative for cough, hemoptysis, shortness of breath, wheezing and stridor.   Cardiovascular: Negative for chest pain, palpitations, orthopnea and leg swelling.  Gastrointestinal: Negative for abdominal pain, blood in stool, diarrhea, melena, nausea and vomiting.  Genitourinary: Positive for frequency and urgency. Negative for dysuria, flank pain and hematuria.  Musculoskeletal: Negative for back pain and joint pain.  Skin: Negative for rash.  Neurological: Negative for dizziness, sensory change, focal weakness, seizures, loss of consciousness, weakness and headaches.  Endo/Heme/Allergies: Negative for polydipsia.  Psychiatric/Behavioral: Negative for depression. The patient is not nervous/anxious.     DRUG ALLERGIES:  No Known Allergies VITALS:  Blood pressure (!) 144/75, pulse (!) 102, temperature (!) 103.2 F (39.6 C), temperature source Oral, resp. rate 20, height 5\' 1"  (1.549 m), weight 106.6 kg, last menstrual period 07/14/2019, SpO2 98 %. PHYSICAL EXAMINATION:  Physical Exam Constitutional:      General: She is not in acute distress.    Appearance: She is obese.  HENT:     Head: Normocephalic.     Mouth/Throat:     Mouth: Mucous membranes are moist.  Eyes:     General: No scleral icterus.    Conjunctiva/sclera: Conjunctivae normal.     Pupils: Pupils are equal, round, and reactive to light.  Neck:   Musculoskeletal: Normal range of motion and neck supple.     Vascular: No JVD.     Trachea: No tracheal deviation.  Cardiovascular:     Rate and Rhythm: Normal rate and regular rhythm.     Heart sounds: Normal heart sounds. No murmur. No gallop.   Pulmonary:     Effort: Pulmonary effort is normal. No respiratory distress.     Breath sounds: Normal breath sounds. No wheezing or rales.  Abdominal:     General: Bowel sounds are normal. There is no distension.     Palpations: Abdomen is soft.     Tenderness: There is no abdominal tenderness. There is no rebound.  Musculoskeletal: Normal range of motion.        General: No tenderness.  Skin:    Findings: No erythema or rash.  Neurological:     General: No focal deficit present.     Mental Status: She is alert and oriented to person, place, and time.     Cranial Nerves: No cranial nerve deficit.  Psychiatric:        Mood and Affect: Mood normal.    LABORATORY PANEL:  Female CBC Recent Labs  Lab 07/20/19 0203  WBC 19.1*  HGB 11.6*  HCT 36.5  PLT 232   ------------------------------------------------------------------------------------------------------------------ Chemistries  Recent Labs  Lab 07/19/19 1316 07/20/19 0203  NA 135 135  K 2.9* 2.9*  CL 105 109  CO2 21* 17*  GLUCOSE 139* 150*  BUN 17 15  CREATININE 1.64* 1.27*  CALCIUM 8.5* 8.1*  MG  --  1.7  AST 23  --   ALT 23  --  ALKPHOS 65  --   BILITOT 1.3*  --    RADIOLOGY:  Ct Abdomen Pelvis W Contrast  Result Date: 07/19/2019 CLINICAL DATA:  Fever, abdominal pain, watery diarrhea, high WBC EXAM: CT ABDOMEN AND PELVIS WITH CONTRAST TECHNIQUE: Multidetector CT imaging of the abdomen and pelvis was performed using the standard protocol following bolus administration of intravenous contrast. CONTRAST:  100mL OMNIPAQUE IOHEXOL 300 MG/ML  SOLN COMPARISON:  06/07/2019 FINDINGS: Lower chest: Bibasilar bandlike scarring or atelectasis. Hepatobiliary: No focal liver  abnormality is seen. Status post cholecystectomy. No biliary dilatation. Pancreas: Unremarkable. No pancreatic ductal dilatation or surrounding inflammatory changes. Spleen: Normal in size without significant abnormality. Adrenals/Urinary Tract: Adrenal glands are unremarkable. Kidneys are normal, without renal calculi, solid lesion, or hydronephrosis. Bladder is unremarkable. Stomach/Bowel: Stomach is within normal limits. The appendix is normal. The colon is fluid-filled to the rectum. Vascular/Lymphatic: No significant vascular findings are present. No enlarged abdominal or pelvic lymph nodes. Reproductive: The right ovary is enlarged by multiple cysts or follicles and adjacent fat stranding in the right lower quadrant, retroperitoneum, and omentum measuring 5.6 x 4.7 x 4.4 cm (series 2, image 66). Other: No abdominal wall hernia or abnormality. No abdominopelvic ascites. Musculoskeletal: No acute or significant osseous findings. IMPRESSION: 1. The right ovary is enlarged by multiple cysts or follicles and adjacent fat stranding in the right lower quadrant, retroperitoneum, and omentum measuring 5.6 x 4.7 x 4.4 cm (series 2, image 66). Consider pelvic ultrasound to further evaluate for ovarian torsion if clinically referable signs and symptoms are present. Of note, the appendix is distinct from this finding in the right lower quadrant and normal in appearance. 2. The colon is fluid-filled to the rectum, in keeping with diarrheal illness. Electronically Signed   By: Lauralyn PrimesAlex  Bibbey M.D.   On: 07/19/2019 17:19   [place] Pelvic Doppler (torsion R/o Or Mass Arterial Flow)  Result Date: 07/19/2019 CLINICAL DATA:  RIGHT ovarian cyst seen on CT exam. EXAM: TRANSABDOMINAL AND TRANSVAGINAL ULTRASOUND OF PELVIS DOPPLER ULTRASOUND OF OVARIES TECHNIQUE: Both transabdominal and transvaginal ultrasound examinations of the pelvis were performed. Transabdominal technique was performed for global imaging of the pelvis including  uterus, ovaries, adnexal regions, and pelvic cul-de-sac. It was necessary to proceed with endovaginal exam following the transabdominal exam to visualize the ovaries. Color and duplex Doppler ultrasound was utilized to evaluate blood flow to the ovaries. COMPARISON:  CT 07/19/2019 FINDINGS: Uterus Measurements: 10.2 x 4.8 x 5.8 centimeters = volume: 151.6 mL. No fibroids or other mass visualized. Endometrium Thickness: 4.7 millimeters.  No focal abnormality visualized. Right ovary Measurements: 6.4 x 5.7 x 4.8 centimeters = volume: 92 mL. Internal characteristics of the ovary can not be discerned, given patient body habitus and limitations of the exam. Left ovary Measurements: 3.4 x 2.6 x 2.9 centimeters = volume: 13 mL. Normal appearance/no adnexal mass. Pulsed Doppler evaluation demonstrates normal low-resistance arterial and venous waveforms in both ovaries. Other: No free pelvic fluid. Study quality is degraded by patient body habitus. IMPRESSION: 1. RIGHT ovary is enlarged compared with the LEFT but detail is limited given patient body habitus. 2. Consider MRI versus follow-up pelvic ultrasound in 8-12 weeks. 3. No evidence for ovarian torsion. No fluid collections or ascites. Inflammatory change seen on recent CT exam is not demonstrated by ultrasound. Electronically Signed   By: Norva PavlovElizabeth  Brown M.D.   On: 07/19/2019 19:20   [place] Pelvic Complete With Transvaginal  Result Date: 07/19/2019 CLINICAL DATA:  RIGHT ovarian cyst seen on CT exam.  EXAM: TRANSABDOMINAL AND TRANSVAGINAL ULTRASOUND OF PELVIS DOPPLER ULTRASOUND OF OVARIES TECHNIQUE: Both transabdominal and transvaginal ultrasound examinations of the pelvis were performed. Transabdominal technique was performed for global imaging of the pelvis including uterus, ovaries, adnexal regions, and pelvic cul-de-sac. It was necessary to proceed with endovaginal exam following the transabdominal exam to visualize the ovaries. Color and duplex Doppler ultrasound  was utilized to evaluate blood flow to the ovaries. COMPARISON:  CT 07/19/2019 FINDINGS: Uterus Measurements: 10.2 x 4.8 x 5.8 centimeters = volume: 151.6 mL. No fibroids or other mass visualized. Endometrium Thickness: 4.7 millimeters.  No focal abnormality visualized. Right ovary Measurements: 6.4 x 5.7 x 4.8 centimeters = volume: 92 mL. Internal characteristics of the ovary can not be discerned, given patient body habitus and limitations of the exam. Left ovary Measurements: 3.4 x 2.6 x 2.9 centimeters = volume: 13 mL. Normal appearance/no adnexal mass. Pulsed Doppler evaluation demonstrates normal low-resistance arterial and venous waveforms in both ovaries. Other: No free pelvic fluid. Study quality is degraded by patient body habitus. IMPRESSION: 1. RIGHT ovary is enlarged compared with the LEFT but detail is limited given patient body habitus. 2. Consider MRI versus follow-up pelvic ultrasound in 8-12 weeks. 3. No evidence for ovarian torsion. No fluid collections or ascites. Inflammatory change seen on recent CT exam is not demonstrated by ultrasound. Electronically Signed   By: Nolon Nations M.D.   On: 07/19/2019 19:20   ASSESSMENT AND PLAN:   Sepsis ([address]) -due to UTI. Continue IV Rocephin and IV fluid support, follow-up CBC, blood culture and urine culture.    Acute renal failure due to dehydration.  Continue IV fluid support. Lactic acidosis.  Follow-up lactic acid level.  Continue treatment as above. Hypokalemia.  Potassium supplement and follow-up level.    HTN (hypertension) -continue home dose antihypertensives Ovarian cysts.  No ovarian torsion per ultrasound.  Follow-up as outpatient.  All the records are reviewed and case discussed with Care Management/Social Worker. Management plans discussed with the patient, family and they are in agreement.  CODE STATUS: Full Code  TOTAL TIME TAKING CARE OF THIS PATIENT: 33 minutes.   More than 50% of the time was spent in  counseling/coordination of care: YES  POSSIBLE D/C IN 2 DAYS, DEPENDING ON CLINICAL CONDITION.   Demetrios Loll M.D on 07/20/2019 at 1:50 PM  Between 7am to 6pm - Pager - [phone]  After 6pm go to [email or web address] - [facility]

## 2019-07-20 NOTE — ED Notes (Signed)
Dr Jannifer Franklin made aware of temp 103.3 oral

## 2019-07-21 LAB — BASIC METABOLIC PANEL
Anion gap: 8 (ref 5–15)
BUN: 9 mg/dL (ref 6–20)
CO2: 21 mmol/L — ABNORMAL LOW (ref 22–32)
Calcium: 8.1 mg/dL — ABNORMAL LOW (ref 8.9–10.3)
Chloride: 110 mmol/L (ref 98–111)
Creatinine, Ser: 1 mg/dL (ref 0.44–1.00)
GFR calc Af Amer: >60 mL/min (ref >60)
GFR calc non Af Amer: >60 mL/min (ref >60)
Glucose, Bld: 97 mg/dL (ref 70–99)
Potassium: 4.2 mmol/L (ref 3.5–5.1)
Sodium: 139 mmol/L (ref 135–145)

## 2019-07-21 LAB — URINE CULTURE

## 2019-07-21 LAB — CBC
HCT: 35.9 % — ABNORMAL LOW (ref 36.0–46.0)
Hemoglobin: 11.4 g/dL — ABNORMAL LOW (ref 12.0–15.0)
MCH: 25.3 pg — ABNORMAL LOW (ref 26.0–34.0)
MCHC: 31.8 g/dL (ref 30.0–36.0)
MCV: 79.6 fL — ABNORMAL LOW (ref 80.0–100.0)
Platelets: 231 10*3/uL (ref 150–400)
RBC: 4.51 MIL/uL (ref 3.87–5.11)
RDW: 17.1 % — ABNORMAL HIGH (ref 11.5–15.5)
WBC: 16.8 10*3/uL — ABNORMAL HIGH (ref 4.0–10.5)
nRBC: 0 % (ref 0.0–0.2)

## 2019-07-21 LAB — LACTIC ACID, PLASMA: Lactic Acid, Venous: 2 mmol/L (ref 0.5–1.9)

## 2019-07-21 MED ORDER — METRONIDAZOLE IN NACL 5-0.79 MG/ML-% IV SOLN
500.0000 mg | Freq: Three times a day (TID) | INTRAVENOUS | Status: DC
  Administered 2019-07-21: 500 mg via INTRAVENOUS
  Filled 2019-07-21 (×3): qty 100

## 2019-07-21 MED ORDER — SODIUM CHLORIDE 0.9 % IV SOLN
1.0000 g | INTRAVENOUS | Status: DC
  Administered 2019-07-21 – 2019-07-23 (×3): 1 g via INTRAVENOUS
  Filled 2019-07-21: qty 1
  Filled 2019-07-21: qty 10
  Filled 2019-07-21 (×2): qty 1

## 2019-07-21 NOTE — TOC Initial Note (Signed)
Transition of Care [facility]) - Initial/Assessment Note    Patient Details  Name: Kristie Santos MRN: [id number] Date of Birth: [birth year]  Transition of Care [facility]) CM/SW Contact:    Shye Doty, Lenice Llamas Phone Number: [phone]  07/21/2019, 12:07 PM  Clinical Narrative: Clinical Social Worker (CSW) met with patient to discuss D/C plan and offer resources. Patient was laying in the bed upon CSW entry and was alert and oriented X4. CSW introduced self and explained role of CSW department. Per patient she lives in [place] alone and was in [place] when she got sick and came to the nearest hospital. Patient reported that she has a job however she can't afford the health insurance that is offered through her employer. Patient reported that she has no health insurance. Patient reported that she has no trouble affording her medications. Patient reported that she does not have a PCP. CSW provided patient with [facility] information in [place]. Per patient she will follow up with community clinic. Patient reported no other needs or concerns. CSW will continue to follow and assist as needed.                  Expected Discharge Plan: Home/Self Care Barriers to Discharge: Continued Medical Work up   Patient Goals and CMS Choice Patient states their goals for this hospitalization and ongoing recovery are:: To feel better      Expected Discharge Plan and Services Expected Discharge Plan: Home/Self Care In-house Referral: Clinical Social Work     Living arrangements for the past 2 months: Single Family Home Expected Discharge Date: 07/21/19                                    Prior Living Arrangements/Services Living arrangements for the past 2 months: Single Family Home Lives with:: Self Patient language and need for interpreter reviewed:: No Do you feel safe going back to the place where you live?: Yes      Need for Family Participation in Patient  Care: No (Comment) Care giver support system in place?: No (comment)   Criminal Activity/Legal Involvement Pertinent to Current Situation/Hospitalization: No - Comment as needed  Activities of Daily Living Home Assistive Devices/Equipment: None ADL Screening (condition at time of admission) Patient's cognitive ability adequate to safely complete daily activities?: Yes Is the patient deaf or have difficulty hearing?: No Does the patient have difficulty seeing, even when wearing glasses/contacts?: No Does the patient have difficulty concentrating, remembering, or making decisions?: No Patient able to express need for assistance with ADLs?: Yes Does the patient have difficulty dressing or bathing?: No Independently performs ADLs?: Yes (appropriate for developmental age) Does the patient have difficulty walking or climbing stairs?: No Weakness of Legs: None Weakness of Arms/Hands: None  Permission Sought/Granted                  Emotional Assessment Appearance:: Appears stated age   Affect (typically observed): Pleasant, Calm Orientation: : Oriented to Self, Oriented to Place, Oriented to  Time, Oriented to Situation Alcohol / Substance Use: Not Applicable Psych Involvement: No (comment)  Admission diagnosis:  Diarrhea of presumed infectious origin [R19.7] Right ovarian cyst [N83.201] Urinary tract infection without hematuria, site unspecified [N39.0] Patient Active Problem List   Diagnosis Date Noted  . Sepsis ([place]) 07/19/2019  . UTI (urinary tract infection) 07/19/2019  . HTN (hypertension) 07/19/2019   PCP:  Patient,  No Pcp Per Pharmacy:   [facility] [address] ([address] [place] [address] [place] [address] Phone: [phone] Fax: [phone]     Social Determinants of Health (SDOH) Interventions    Readmission Risk Interventions No flowsheet data found.

## 2019-07-21 NOTE — Progress Notes (Signed)
[employer] - [place] at [facility]   PATIENT NAME: Kristie DresserKristy Santos    MR#:  [id number]  DATE OF BIRTH:  02/02/1976  SUBJECTIVE:  CHIEF COMPLAINT:   Chief Complaint  Patient presents with  . Abdominal Pain  . Diarrhea   The patient has no complaints. REVIEW OF SYSTEMS:  Review of Systems  Constitutional: Negative for chills, fever and malaise/fatigue.  HENT: Negative for sore throat.   Eyes: Negative for blurred vision and double vision.  Respiratory: Negative for cough, hemoptysis, shortness of breath, wheezing and stridor.   Cardiovascular: Negative for chest pain, palpitations, orthopnea and leg swelling.  Gastrointestinal: Negative for abdominal pain, blood in stool, diarrhea, melena, nausea and vomiting.  Genitourinary: Negative for dysuria, flank pain, frequency, hematuria and urgency.  Musculoskeletal: Negative for back pain and joint pain.  Skin: Negative for rash.  Neurological: Negative for dizziness, sensory change, focal weakness, seizures, loss of consciousness, weakness and headaches.  Endo/Heme/Allergies: Negative for polydipsia.  Psychiatric/Behavioral: Negative for depression. The patient is not nervous/anxious.     DRUG ALLERGIES:  No Known Allergies VITALS:  Blood pressure 128/68, pulse 97, temperature 98.2 F (36.8 C), temperature source Oral, resp. rate 18, height 5\' 1"  (1.549 m), weight 106.6 kg, last menstrual period 07/14/2019, SpO2 98 %. PHYSICAL EXAMINATION:  Physical Exam Constitutional:      General: She is not in acute distress.    Appearance: She is obese.  HENT:     Head: Normocephalic.     Mouth/Throat:     Mouth: Mucous membranes are moist.  Eyes:     General: No scleral icterus.    Conjunctiva/sclera: Conjunctivae normal.     Pupils: Pupils are equal, round, and reactive to light.  Neck:     Musculoskeletal: Normal range of motion and neck supple.     Vascular: No JVD.     Trachea: No tracheal deviation.   Cardiovascular:     Rate and Rhythm: Normal rate and regular rhythm.     Heart sounds: Normal heart sounds. No murmur. No gallop.   Pulmonary:     Effort: Pulmonary effort is normal. No respiratory distress.     Breath sounds: Normal breath sounds. No wheezing or rales.  Abdominal:     General: Bowel sounds are normal. There is no distension.     Palpations: Abdomen is soft.     Tenderness: There is no abdominal tenderness. There is no rebound.  Musculoskeletal: Normal range of motion.        General: No tenderness.  Skin:    Findings: No erythema or rash.  Neurological:     General: No focal deficit present.     Mental Status: She is alert and oriented to person, place, and time.     Cranial Nerves: No cranial nerve deficit.  Psychiatric:        Mood and Affect: Mood normal.    LABORATORY PANEL:  Female CBC Recent Labs  Lab 07/21/19 0349  WBC 16.8*  HGB 11.4*  HCT 35.9*  PLT 231   ------------------------------------------------------------------------------------------------------------------ Chemistries  Recent Labs  Lab 07/19/19 1316 07/20/19 0203 07/21/19 0349  NA 135 135 139  K 2.9* 2.9* 4.2  CL 105 109 110  CO2 21* 17* 21*  GLUCOSE 139* 150* 97  BUN 17 15 9   CREATININE 1.64* 1.27* 1.00  CALCIUM 8.5* 8.1* 8.1*  MG  --  1.7  --   AST 23  --   --   ALT 23  --   --  ALKPHOS 65  --   --   BILITOT 1.3*  --   --    RADIOLOGY:  No results found. ASSESSMENT AND PLAN:   Sepsis ([place]) -due to UTI. Continue IV Rocephin and IV fluid support, follow-up CBC, blood culture is negative so far and urine culture: Multiple species present.  Acute renal failure due to dehydration.  Improved with IV fluid support. Lactic acidosis.  Improved with above treatment. Hypokalemia.  Potassium supplement and follow-up level.    HTN (hypertension) -blood pressure is controlled without medication. Ovarian cysts.  No ovarian torsion per ultrasound.  OB/GYN consult. Morbid  obesity.  Advised diet control and exercise.  Follow-up PCP. All the records are reviewed and case discussed with Care Management/Social Worker. Management plans discussed with the patient, family and they are in agreement.  CODE STATUS: Full Code  TOTAL TIME TAKING CARE OF THIS PATIENT: 33 minutes.   More than 50% of the time was spent in counseling/coordination of care: YES  POSSIBLE D/C IN 2 DAYS, DEPENDING ON CLINICAL CONDITION.   Demetrios Loll M.D on 07/21/2019 at 2:01 PM  Between 7am to 6pm - Pager - [phone]  After 6pm go to [email or web address] - [employer] [facility]

## 2019-07-21 NOTE — Consult Note (Signed)
Consult History and Physical   SERVICE: Gynecology   Patient Name: Kristie Santos Patient MRN:   [id number]  CC: Bilateral lower abdominal pain  HPI: Kristie Santos is a 43 y.o. F G1P0010 presenting with bilateral lower abdominal pain and diarrhea, dx with UTI sepsis. CT scan with concern for right ovarian cyst with adjacent fat stranding. Ultrasound without abnormal ovarian or uterine findings. Hx of UTI, but no hx of pelvic pain. Hx of STDs >4443yrs ago. Hx of cryotherapy for abnormal pap smear >3343yrs ago. No fhx of gyn malignancy. Prior SAB without surgical intervention. On no hormones or contraception.  Leukocytosis with neg blood cultures so far, and urine culture with mixed flora at 24hrs. Tmax 103.30 overnight on 7/26, now afebrile x 24hrs. Lactic acid still elevated.  Pt received cipro/flagyl in the ER and now on q24hr ceftriaxone. No CVA tenderness.  IVF treating dehydration with resolving acute kidney failure, cr normalizing (apex at 1.64) normal BUN  C. Diff negative, GI panel negative. Hx of lap chole; no other abd or pelvic surgeries.    Review of Systems: positives in bold GEN:   fevers, chills, weight changes, appetite changes, fatigue, night sweats HEENT:  HA, vision changes, hearing loss, congestion, rhinorrhea, sinus pressure, dysphagia CV:   CP, palpitations PULM:  SOB, cough GI:  abd pain, N/V/D/C GU:  dysuria, urgency, frequency MSK:  arthralgias, myalgias, back pain, swelling SKIN:  rashes, color changes, pallor NEURO:  numbness, weakness, tingling, seizures, dizziness, tremors HEME/LYMPH:  easy bruising or bleeding ENDO:  heat/cold intolerance  Past Obstetrical History: OB History   No obstetric history on file.     Past Gynecologic History: Patient's last menstrual period was 07/14/2019 (within days). Menstrual frequency Q 4 wks lasting 7 days. No dysmenorrhea, but occasional menorrhagia.  Past Medical History: Past Medical History:  Diagnosis  Date  . HTN (hypertension)     Past Surgical History:   Past Surgical History:  Procedure Laterality Date  . CHOLECYSTECTOMY    . TONSILLECTOMY      Family History:  family history includes Diabetes in her mother.  Social History:  Social History   Socioeconomic History  . Marital status: Single    Spouse name: Not on file  . Number of children: Not on file  . Years of education: Not on file  . Highest education level: Not on file  Occupational History  . Not on file  Social Needs  . Financial resource strain: Not on file  . Food insecurity    Worry: Not on file    Inability: Not on file  . Transportation needs    Medical: Not on file    Non-medical: Not on file  Tobacco Use  . Smoking status: Never Smoker  . Smokeless tobacco: Never Used  Substance and Sexual Activity  . Alcohol use: No  . Drug use: No  . Sexual activity: Not on file  Lifestyle  . Physical activity    Days per week: Not on file    Minutes per session: Not on file  . Stress: Not on file  Relationships  . Social Musicianconnections    Talks on phone: Not on file    Gets together: Not on file    Attends religious service: Not on file    Active member of club or organization: Not on file    Attends meetings of clubs or organizations: Not on file    Relationship status: Not on file  . Intimate partner violence  Fear of current or ex partner: Not on file    Emotionally abused: Not on file    Physically abused: Not on file    Forced sexual activity: Not on file  Other Topics Concern  . Not on file  Social History Narrative  . Not on file    Home Medications:  Medications reconciled in EPIC  No current facility-administered medications on file prior to encounter.    Current Outpatient Medications on File Prior to Encounter  Medication Sig Dispense Refill  . acetaminophen (TYLENOL) 325 MG tablet Take 650 mg by mouth every 6 (six) hours as needed.      Allergies:  No Known  Allergies  Physical Exam:  Temp:  [98.2 F (36.8 C)-99.4 F (37.4 C)] 99.4 F (37.4 C) (07/27 1611) Pulse Rate:  [71-97] 95 (07/27 1611) Resp:  [16-18] 18 (07/27 1611) BP: (118-128)/(68-82) 118/74 (07/27 1611) SpO2:  [98 %-100 %] 100 % (07/27 1611)   General Appearance:  Well developed, well nourished, no acute distress, alert and oriented x3 HEENT:  Normocephalic atraumatic, extraocular movements intact, moist mucous membranes Cardiovascular:  Normal S1/S2, regular rate and rhythm, no murmurs Pulmonary:  clear to auscultation, no wheezes, rales or rhonchi, symmetric air entry, good air exchange Abdomen:  Bowel sounds present, soft, nondistended, no abnormal masses, no epigastric pain, very minimally diffusely tender throughout, negative Murphy's sign, negative rovsing's sign Extremities:  Full range of motion, no pedal edema, no tenderness Skin:  normal coloration and turgor, no rashes, no suspicious skin lesions noted  Neurologic:  Cranial nerves 2-12 grossly intact, Psychiatric:  Normal mood and affect, appropriate, no AH/VH Pelvic:  Deferred. Denies pelvic pain or pain with ultrasound.   Labs/Studies:   CBC and Coags:  Lab Results  Component Value Date   WBC 16.8 (H) 07/21/2019   NEUTOPHILPCT 85 06/07/2019   EOSPCT 0 06/07/2019   BASOPCT 0 06/07/2019   LYMPHOPCT 9 06/07/2019   HGB 11.4 (L) 07/21/2019   HCT 35.9 (L) 07/21/2019   MCV 79.6 (L) 07/21/2019   PLT 231 07/21/2019   CMP:  Lab Results  Component Value Date   NA 139 07/21/2019   K 4.2 07/21/2019   CL 110 07/21/2019   CO2 21 (L) 07/21/2019   BUN 9 07/21/2019   CREATININE 1.00 07/21/2019   CREATININE 1.27 (H) 07/20/2019   CREATININE 1.64 (H) 07/19/2019   PROT 7.8 07/19/2019   BILITOT 1.3 (H) 07/19/2019   ALT 23 07/19/2019   AST 23 07/19/2019   ALKPHOS 65 07/19/2019    Other Imaging: Ct Abdomen Pelvis W Contrast  Result Date: 07/19/2019 CLINICAL DATA:  Fever, abdominal pain, watery diarrhea, high  WBC EXAM: CT ABDOMEN AND PELVIS WITH CONTRAST TECHNIQUE: Multidetector CT imaging of the abdomen and pelvis was performed using the standard protocol following bolus administration of intravenous contrast. CONTRAST:  100mL OMNIPAQUE IOHEXOL 300 MG/ML  SOLN COMPARISON:  06/07/2019 FINDINGS: Lower chest: Bibasilar bandlike scarring or atelectasis. Hepatobiliary: No focal liver abnormality is seen. Status post cholecystectomy. No biliary dilatation. Pancreas: Unremarkable. No pancreatic ductal dilatation or surrounding inflammatory changes. Spleen: Normal in size without significant abnormality. Adrenals/Urinary Tract: Adrenal glands are unremarkable. Kidneys are normal, without renal calculi, solid lesion, or hydronephrosis. Bladder is unremarkable. Stomach/Bowel: Stomach is within normal limits. The appendix is normal. The colon is fluid-filled to the rectum. Vascular/Lymphatic: No significant vascular findings are present. No enlarged abdominal or pelvic lymph nodes. Reproductive: The right ovary is enlarged by multiple cysts or follicles and adjacent fat  stranding in the right lower quadrant, retroperitoneum, and omentum measuring 5.6 x 4.7 x 4.4 cm (series 2, image 66). Other: No abdominal wall hernia or abnormality. No abdominopelvic ascites. Musculoskeletal: No acute or significant osseous findings. IMPRESSION: 1. The right ovary is enlarged by multiple cysts or follicles and adjacent fat stranding in the right lower quadrant, retroperitoneum, and omentum measuring 5.6 x 4.7 x 4.4 cm (series 2, image 66). Consider pelvic ultrasound to further evaluate for ovarian torsion if clinically referable signs and symptoms are present. Of note, the appendix is distinct from this finding in the right lower quadrant and normal in appearance. 2. The colon is fluid-filled to the rectum, in keeping with diarrheal illness. Electronically Signed   By: Lauralyn PrimesAlex  Bibbey M.D.   On: 07/19/2019 17:19   [place] Pelvic Doppler (torsion R/o Or  Mass Arterial Flow)  Result Date: 07/19/2019 CLINICAL DATA:  RIGHT ovarian cyst seen on CT exam. EXAM: TRANSABDOMINAL AND TRANSVAGINAL ULTRASOUND OF PELVIS DOPPLER ULTRASOUND OF OVARIES TECHNIQUE: Both transabdominal and transvaginal ultrasound examinations of the pelvis were performed. Transabdominal technique was performed for global imaging of the pelvis including uterus, ovaries, adnexal regions, and pelvic cul-de-sac. It was necessary to proceed with endovaginal exam following the transabdominal exam to visualize the ovaries. Color and duplex Doppler ultrasound was utilized to evaluate blood flow to the ovaries. COMPARISON:  CT 07/19/2019 FINDINGS: Uterus Measurements: 10.2 x 4.8 x 5.8 centimeters = volume: 151.6 mL. No fibroids or other mass visualized. Endometrium Thickness: 4.7 millimeters.  No focal abnormality visualized. Right ovary Measurements: 6.4 x 5.7 x 4.8 centimeters = volume: 92 mL. Internal characteristics of the ovary can not be discerned, given patient body habitus and limitations of the exam. Left ovary Measurements: 3.4 x 2.6 x 2.9 centimeters = volume: 13 mL. Normal appearance/no adnexal mass. Pulsed Doppler evaluation demonstrates normal low-resistance arterial and venous waveforms in both ovaries. Other: No free pelvic fluid. Study quality is degraded by patient body habitus. IMPRESSION: 1. RIGHT ovary is enlarged compared with the LEFT but detail is limited given patient body habitus. 2. Consider MRI versus follow-up pelvic ultrasound in 8-12 weeks. 3. No evidence for ovarian torsion. No fluid collections or ascites. Inflammatory change seen on recent CT exam is not demonstrated by ultrasound. Electronically Signed   By: Norva PavlovElizabeth  Brown M.D.   On: 07/19/2019 19:20   [place] Pelvic Complete With Transvaginal  Result Date: 07/19/2019 CLINICAL DATA:  RIGHT ovarian cyst seen on CT exam. EXAM: TRANSABDOMINAL AND TRANSVAGINAL ULTRASOUND OF PELVIS DOPPLER ULTRASOUND OF OVARIES TECHNIQUE: Both  transabdominal and transvaginal ultrasound examinations of the pelvis were performed. Transabdominal technique was performed for global imaging of the pelvis including uterus, ovaries, adnexal regions, and pelvic cul-de-sac. It was necessary to proceed with endovaginal exam following the transabdominal exam to visualize the ovaries. Color and duplex Doppler ultrasound was utilized to evaluate blood flow to the ovaries. COMPARISON:  CT 07/19/2019 FINDINGS: Uterus Measurements: 10.2 x 4.8 x 5.8 centimeters = volume: 151.6 mL. No fibroids or other mass visualized. Endometrium Thickness: 4.7 millimeters.  No focal abnormality visualized. Right ovary Measurements: 6.4 x 5.7 x 4.8 centimeters = volume: 92 mL. Internal characteristics of the ovary can not be discerned, given patient body habitus and limitations of the exam. Left ovary Measurements: 3.4 x 2.6 x 2.9 centimeters = volume: 13 mL. Normal appearance/no adnexal mass. Pulsed Doppler evaluation demonstrates normal low-resistance arterial and venous waveforms in both ovaries. Other: No free pelvic fluid. Study quality is degraded by patient body  habitus. IMPRESSION: 1. RIGHT ovary is enlarged compared with the LEFT but detail is limited given patient body habitus. 2. Consider MRI versus follow-up pelvic ultrasound in 8-12 weeks. 3. No evidence for ovarian torsion. No fluid collections or ascites. Inflammatory change seen on recent CT exam is not demonstrated by ultrasound. Electronically Signed   By: Nolon Nations M.D.   On: 07/19/2019 19:20     Assessment / Plan:   FLORENE BRILL is a 43 y.o. G1P0 who presents with sx of UTI sepsis, diarrhea and pelvic pain. CT positive for possible infectious finding in ovary, but ultrasound (of poor quality) negative for the same. Because she has no pelvic pain with TVUS nor free pelvic fluid, hydrosalpinx or TOA is less likely. Possible complex cyst on CT scan.  1. Agree with continue abx. If minimal improvement,  would recommend dx lap for evaluation of right adnexa.  2. Repeat TVUS in am after 48hrs of abx to see if improvement. If no useful info, consider MRI 3. Will continue to follow.  Thank you for the opportunity to be involved with this pt's care.

## 2019-07-22 ENCOUNTER — Inpatient Hospital Stay: Payer: Self-pay

## 2019-07-22 LAB — CBC
HCT: 35.3 % — ABNORMAL LOW (ref 36.0–46.0)
Hemoglobin: 11.2 g/dL — ABNORMAL LOW (ref 12.0–15.0)
MCH: 24.9 pg — ABNORMAL LOW (ref 26.0–34.0)
MCHC: 31.7 g/dL (ref 30.0–36.0)
MCV: 78.4 fL — ABNORMAL LOW (ref 80.0–100.0)
Platelets: 278 10*3/uL (ref 150–400)
RBC: 4.5 MIL/uL (ref 3.87–5.11)
RDW: 17 % — ABNORMAL HIGH (ref 11.5–15.5)
WBC: 15.3 10*3/uL — ABNORMAL HIGH (ref 4.0–10.5)
nRBC: 0 % (ref 0.0–0.2)

## 2019-07-22 LAB — HIV ANTIBODY (ROUTINE TESTING W REFLEX): HIV Screen 4th Generation wRfx: NONREACTIVE

## 2019-07-22 LAB — HSV(HERPES SIMPLEX VRS) I + II AB-IGG
HSV 1 Glycoprotein G Ab, IgG: >62.2 index — ABNORMAL HIGH (ref 0.00–0.90)
HSV 2 Glycoprotein G Ab, IgG: <0.91 index (ref 0.00–0.90)

## 2019-07-22 LAB — RPR: RPR Ser Ql: NONREACTIVE

## 2019-07-22 NOTE — Progress Notes (Signed)
[place] [facility] [place] NAME: Kristie Santos    MR#:  [id number]  DATE OF BIRTH:  [birth year]  SUBJECTIVE:  CHIEF COMPLAINT:   Chief Complaint  Patient presents with  . Abdominal Pain  . Diarrhea   The patient has no complaints. REVIEW OF SYSTEMS:  Review of Systems  Constitutional: Negative for chills, fever and malaise/fatigue.  HENT: Negative for sore throat.   Eyes: Negative for blurred vision and double vision.  Respiratory: Negative for cough, hemoptysis, shortness of breath, wheezing and stridor.   Cardiovascular: Negative for chest pain, palpitations, orthopnea and leg swelling.  Gastrointestinal: Negative for abdominal pain, blood in stool, diarrhea, melena, nausea and vomiting.  Genitourinary: Negative for dysuria, flank pain, frequency, hematuria and urgency.  Musculoskeletal: Negative for back pain and joint pain.  Skin: Negative for rash.  Neurological: Negative for dizziness, sensory change, focal weakness, seizures, loss of consciousness, weakness and headaches.  Endo/Heme/Allergies: Negative for polydipsia.  Psychiatric/Behavioral: Negative for depression. The patient is not nervous/anxious.     DRUG ALLERGIES:  No Known Allergies VITALS:  Blood pressure 137/74, pulse 96, temperature 99.9 F (37.7 C), temperature source Oral, resp. rate 18, height 5\' 1"  (1.549 m), weight 106.6 kg, last menstrual period 07/14/2019, SpO2 100 %. PHYSICAL EXAMINATION:  Physical Exam Constitutional:      General: She is not in acute distress.    Appearance: She is obese.  HENT:     Head: Normocephalic.     Mouth/Throat:     Mouth: Mucous membranes are moist.  Eyes:     General: No scleral icterus.    Conjunctiva/sclera: Conjunctivae normal.     Pupils: Pupils are equal, round, and reactive to light.  Neck:     Musculoskeletal: Normal range of motion and neck supple.     Vascular: No JVD.     Trachea: No tracheal deviation.   Cardiovascular:     Rate and Rhythm: Normal rate and regular rhythm.     Heart sounds: Normal heart sounds. No murmur. No gallop.   Pulmonary:     Effort: Pulmonary effort is normal. No respiratory distress.     Breath sounds: Normal breath sounds. No wheezing or rales.  Abdominal:     General: Bowel sounds are normal. There is no distension.     Palpations: Abdomen is soft.     Tenderness: There is no abdominal tenderness. There is no rebound.  Musculoskeletal: Normal range of motion.        General: No tenderness.  Skin:    Findings: No erythema or rash.  Neurological:     General: No focal deficit present.     Mental Status: She is alert and oriented to person, place, and time.     Cranial Nerves: No cranial nerve deficit.  Psychiatric:        Mood and Affect: Mood normal.    LABORATORY PANEL:  Female CBC Recent Labs  Lab 07/22/19 0342  WBC 15.3*  HGB 11.2*  HCT 35.3*  PLT 278   ------------------------------------------------------------------------------------------------------------------ Chemistries  Recent Labs  Lab 07/19/19 1316 07/20/19 0203 07/21/19 0349  NA 135 135 139  K 2.9* 2.9* 4.2  CL 105 109 110  CO2 21* 17* 21*  GLUCOSE 139* 150* 97  BUN 17 15 9   CREATININE 1.64* 1.27* 1.00  CALCIUM 8.5* 8.1* 8.1*  MG  --  1.7  --   AST 23  --   --   ALT 23  --   --  ALKPHOS 65  --   --   BILITOT 1.3*  --   --    RADIOLOGY:  [place] Pelvic Complete With Transvaginal  Result Date: 07/22/2019 CLINICAL DATA:  Follow-up adnexal mass. EXAM: TRANSABDOMINAL ULTRASOUND OF PELVIS DOPPLER ULTRASOUND OF OVARIES TECHNIQUE: Transabdominal ultrasound examination of the pelvis was performed including evaluation of the uterus, ovaries, adnexal regions, and pelvic cul-de-sac. Color and duplex Doppler ultrasound was utilized to evaluate blood flow to the ovaries. Both transabdominal and transvaginal ultrasound examinations of the pelvis were performed. Transabdominal technique was  performed for global imaging of the pelvis including uterus, ovaries, adnexal regions, and pelvic cul-de-sac. It was necessary to proceed with endovaginal exam following the transabdominal exam to visualize the ovaries. COMPARISON:  CT 06/19/2019. FINDINGS: Uterus Measurements: 8.4 x 4.4 x 4.4 cm = volume: 84 mL. No fibroids or other mass visualized. Endometrium Thickness: 5.4.  No focal abnormality visualized. Right ovary Measurements: 4.8 x 2.7 x 2.5 cm = volume: 16 mL. Complex right adnexal fluid collection measuring 4.1 x 5.2 x 3.2 cm corresponding to CT abnormality. Left ovary Measurements: 4.1 x 2.2 x 2.7 cm = volume: 12 mL. Normal appearance/no adnexal mass. Pulsed Doppler evaluation demonstrates bilateral ovarian color flow. Other: No prominent free fluid collections. IMPRESSION: 4.1 x 5.2 x 3.2 cm complex mass/fluid collection in the right adnexa. This corresponds to CT abnormality. Differential diagnosis would include ectopic pregnancy, tubo-ovarian abscess, and tumor. Pregnancy test suggested for further evaluation to exclude ectopic pregnancy. Ovarian torsion unlikely as bilateral ovarian color flow noted on Doppler. Electronically Signed   By: Maisie Fushomas  Register   On: 07/22/2019 13:29   ASSESSMENT AND PLAN:   Sepsis (HCC) -due to UTI. Continue IV Rocephin and IV fluid support, follow-up CBC, blood culture is negative so far and urine culture: Multiple species present.  Leukocytosis is improving slowly.  Acute renal failure due to dehydration.  Improved with IV fluid support. Lactic acidosis.  Improved with above treatment. Hypokalemia.  Improved with potassium supplement.    HTN (hypertension) -blood pressure is controlled without medication. Ovarian cysts.  No ovarian torsion per ultrasound. Per OB/GYN consult, repeat TVUS in am after 48hrs of abx to see if improvement. If no useful info, consider MRI. TVUS: 4.1 x 5.2 x 3.2 cm complex mass/fluid collection in the right adnexa.  Morbid  obesity.  Advised diet control and exercise.  Follow-up PCP. All the records are reviewed and case discussed with Care Management/Social Worker. Management plans discussed with the patient, family and they are in agreement.  CODE STATUS: Full Code   TOTAL TIME TAKING CARE OF THIS PATIENT: 26 minutes.   More than 50% of the time was spent in counseling/coordination of care: YES  POSSIBLE D/C IN 1-2 DAYS, DEPENDING ON CLINICAL CONDITION.   Shaune PollackQing Lanna Labella M.D on 07/22/2019 at 2:43 PM  Between 7am to 6pm - Pager - [phone]  After 6pm go to [email or web address] - Therapist, nutritionalpassword [email or web address]  [facility]

## 2019-07-23 LAB — LACTIC ACID, PLASMA: Lactic Acid, Venous: 1.2 mmol/L (ref 0.5–1.9)

## 2019-07-23 LAB — CBC
HCT: 33 % — ABNORMAL LOW (ref 36.0–46.0)
Hemoglobin: 10.6 g/dL — ABNORMAL LOW (ref 12.0–15.0)
MCH: 25 pg — ABNORMAL LOW (ref 26.0–34.0)
MCHC: 32.1 g/dL (ref 30.0–36.0)
MCV: 77.8 fL — ABNORMAL LOW (ref 80.0–100.0)
Platelets: 321 K/uL (ref 150–400)
RBC: 4.24 MIL/uL (ref 3.87–5.11)
RDW: 17.2 % — ABNORMAL HIGH (ref 11.5–15.5)
WBC: 15.8 K/uL — ABNORMAL HIGH (ref 4.0–10.5)
nRBC: 0 % (ref 0.0–0.2)

## 2019-07-23 LAB — TYPE AND SCREEN
ABO/RH(D): O POS
Antibody Screen: NEGATIVE

## 2019-07-23 LAB — PREGNANCY, URINE: Preg Test, Ur: NEGATIVE

## 2019-07-23 MED ORDER — PROPOFOL 10 MG/ML IV BOLUS
INTRAVENOUS | Status: AC
  Filled 2019-07-23: qty 40

## 2019-07-23 MED ORDER — SUCCINYLCHOLINE CHLORIDE 20 MG/ML IJ SOLN
INTRAMUSCULAR | Status: AC
  Filled 2019-07-23: qty 1

## 2019-07-23 MED ORDER — METOPROLOL TARTRATE 25 MG PO TABS
12.5000 mg | ORAL_TABLET | Freq: Two times a day (BID) | ORAL | Status: DC
  Administered 2019-07-23 – 2019-07-25 (×5): 12.5 mg via ORAL
  Filled 2019-07-23: qty 0.5
  Filled 2019-07-23 (×3): qty 1
  Filled 2019-07-23: qty 0.5
  Filled 2019-07-23: qty 1
  Filled 2019-07-23: qty 0.5

## 2019-07-23 MED ORDER — LIDOCAINE HCL (PF) 2 % IJ SOLN
INTRAMUSCULAR | Status: AC
  Filled 2019-07-23: qty 10

## 2019-07-23 MED ORDER — FENTANYL CITRATE (PF) 250 MCG/5ML IJ SOLN
INTRAMUSCULAR | Status: AC
  Filled 2019-07-23: qty 5

## 2019-07-23 MED ORDER — ROCURONIUM BROMIDE 50 MG/5ML IV SOLN
INTRAVENOUS | Status: AC
  Filled 2019-07-23: qty 1

## 2019-07-23 MED ORDER — ACETAMINOPHEN 500 MG PO TABS
1000.0000 mg | ORAL_TABLET | ORAL | Status: AC
  Administered 2019-07-24: 1000 mg via ORAL
  Filled 2019-07-23: qty 2

## 2019-07-23 MED ORDER — GABAPENTIN 300 MG PO CAPS
300.0000 mg | ORAL_CAPSULE | ORAL | Status: AC
  Administered 2019-07-24: 06:00:00 300 mg via ORAL
  Filled 2019-07-23: qty 1

## 2019-07-23 MED ORDER — MIDAZOLAM HCL 2 MG/2ML IJ SOLN
INTRAMUSCULAR | Status: AC
  Filled 2019-07-23: qty 2

## 2019-07-23 MED ORDER — DOXYCYCLINE HYCLATE 100 MG PO TABS
100.0000 mg | ORAL_TABLET | Freq: Two times a day (BID) | ORAL | Status: DC
  Administered 2019-07-23 – 2019-07-27 (×9): 100 mg via ORAL
  Filled 2019-07-23 (×10): qty 1

## 2019-07-23 NOTE — Progress Notes (Signed)
Discussed dx lap with possible abscess debridement with patient after 72hrs of abx. Doxycycline added today. She is stable without evidence of TOA rupture. However, her WBC not falling. Lactic acid normalized today.  Risks of surgery including bleeding which may require transfusion or reoperation, infection, injury to bowel or other surrounding organs, need for additional procedures including laparotomy were explained to patient and written informed consent was obtained.  Patient has been NPO since midnight and she will remain NPO for procedure. Anesthesia and OR aware.  Preoperative prophylactic antibiotics if indicated and SCDs ordered on call to the OR.  To OR when ready.     

## 2019-07-23 NOTE — Progress Notes (Addendum)
[place] at [place] NAME: Kristie Santos    MR#:  [id number]  DATE OF BIRTH:  09/20/1976  SUBJECTIVE:  CHIEF COMPLAINT:   Chief Complaint  Patient presents with  . Abdominal Pain  . Diarrhea   The patient has RLQ pain. REVIEW OF SYSTEMS:  Review of Systems  Constitutional: Negative for chills, fever and malaise/fatigue.  HENT: Negative for sore throat.   Eyes: Negative for blurred vision and double vision.  Respiratory: Negative for cough, hemoptysis, shortness of breath, wheezing and stridor.   Cardiovascular: Negative for chest pain, palpitations, orthopnea and leg swelling.  Gastrointestinal: Positive for abdominal pain. Negative for blood in stool, diarrhea, melena, nausea and vomiting.  Genitourinary: Negative for dysuria, flank pain, frequency, hematuria and urgency.  Musculoskeletal: Negative for back pain and joint pain.  Skin: Negative for rash.  Neurological: Negative for dizziness, sensory change, focal weakness, seizures, loss of consciousness, weakness and headaches.  Endo/Heme/Allergies: Negative for polydipsia.  Psychiatric/Behavioral: Negative for depression. The patient is not nervous/anxious.     DRUG ALLERGIES:  No Known Allergies VITALS:  Blood pressure (!) 138/103, pulse (!) 101, temperature 98.3 F (36.8 C), temperature source Oral, resp. rate 18, height 5\' 1"  (1.549 m), weight 106.6 kg, last menstrual period 07/14/2019, SpO2 98 %. PHYSICAL EXAMINATION:  Physical Exam Constitutional:      General: She is not in acute distress.    Appearance: She is obese.  HENT:     Head: Normocephalic.     Mouth/Throat:     Mouth: Mucous membranes are moist.  Eyes:     General: No scleral icterus.    Conjunctiva/sclera: Conjunctivae normal.     Pupils: Pupils are equal, round, and reactive to light.  Neck:     Musculoskeletal: Normal range of motion and neck supple.     Vascular: No JVD.     Trachea: No  tracheal deviation.  Cardiovascular:     Rate and Rhythm: Normal rate and regular rhythm.     Heart sounds: Normal heart sounds. No murmur. No gallop.   Pulmonary:     Effort: Pulmonary effort is normal. No respiratory distress.     Breath sounds: Normal breath sounds. No wheezing or rales.  Abdominal:     General: Bowel sounds are normal. There is no distension.     Palpations: Abdomen is soft.     Tenderness: There is no abdominal tenderness. There is no rebound.  Musculoskeletal: Normal range of motion.        General: No tenderness.  Skin:    Findings: No erythema or rash.  Neurological:     General: No focal deficit present.     Mental Status: She is alert and oriented to person, place, and time.     Cranial Nerves: No cranial nerve deficit.  Psychiatric:        Mood and Affect: Mood normal.    LABORATORY PANEL:  Female CBC Recent Labs  Lab 07/23/19 0300  WBC 15.8*  HGB 10.6*  HCT 33.0*  PLT 321   ------------------------------------------------------------------------------------------------------------------ Chemistries  Recent Labs  Lab 07/19/19 1316 07/20/19 0203 07/21/19 0349  NA 135 135 139  K 2.9* 2.9* 4.2  CL 105 109 110  CO2 21* 17* 21*  GLUCOSE 139* 150* 97  BUN 17 15 9   CREATININE 1.64* 1.27* 1.00  CALCIUM 8.5* 8.1* 8.1*  MG  --  1.7  --   AST 23  --   --  ALT 23  --   --   ALKPHOS 65  --   --   BILITOT 1.3*  --   --    RADIOLOGY:  No results found. ASSESSMENT AND PLAN:   Sepsis (HCC) -due to UTI. Continue IV Rocephin and IV fluid support, follow-up CBC, blood culture is negative so far and urine culture: Multiple species present.  Leukocytosis is worsening. Add doxycycline 100 mg twice daily.  Acute renal failure due to dehydration.  Improved with IV fluid support. Lactic acidosis.  Improved with above treatment. Hypokalemia.  Improved with potassium supplement.    HTN (hypertension) -blood pressure is a little elevated.  Start  Lopressor 12.5 mg twice daily. Ovarian cysts.  No ovarian torsion per ultrasound. Per OB/GYN consult, repeat TVUS in am after 48hrs of abx to see if improvement. If no useful info, consider MRI. TVUS: 4.1 x 5.2 x 3.2 cm complex mass/fluid collection in the right adnexa. Per Dr. Dalbert GarnetBeasley, laparoscopic surgery for possible abscess tomorrow.  Morbid obesity.  Advised diet control and exercise.  Follow-up PCP.  I discussed with Dr. Dalbert GarnetBeasley. All the records are reviewed and case discussed with Care Management/Social Worker. Management plans discussed with the patient, family and they are in agreement.  CODE STATUS: Full Code   TOTAL TIME TAKING CARE OF THIS PATIENT: 32 minutes.   More than 50% of the time was spent in counseling/coordination of care: YES  POSSIBLE D/C IN 2 DAYS, DEPENDING ON CLINICAL CONDITION.   Shaune PollackQing Maribell Demeo M.D on 07/23/2019 at 12:08 PM  Between 7am to 6pm - Pager - [phone]  After 6pm go to [email or web address] - Therapist, nutritionalpassword [email or web address]  [facility] [place] [facility]

## 2019-07-23 NOTE — H&P (View-Only) (Signed)
Discussed dx lap with possible abscess debridement with patient after 72hrs of abx. Doxycycline added today. She is stable without evidence of TOA rupture. However, her WBC not falling. Lactic acid normalized today.  Risks of surgery including bleeding which may require transfusion or reoperation, infection, injury to bowel or other surrounding organs, need for additional procedures including laparotomy were explained to patient and written informed consent was obtained.  Patient has been NPO since midnight and she will remain NPO for procedure. Anesthesia and OR aware.  Preoperative prophylactic antibiotics if indicated and SCDs ordered on call to the OR.  To OR when ready.

## 2019-07-23 NOTE — Anesthesia Preprocedure Evaluation (Addendum)
Anesthesia Evaluation  Patient identified by MRN, date of birth, ID band Patient awake    Reviewed: Allergy & Precautions, NPO status , Patient's Chart, lab work & pertinent test results, reviewed documented beta blocker date and time   Airway Mallampati: III  TM Distance: >3 FB     Dental  (+) Chipped   Pulmonary           Cardiovascular hypertension, Pt. on medications      Neuro/Psych    GI/Hepatic   Endo/Other    Renal/GU      Musculoskeletal   Abdominal   Peds  Hematology   Anesthesia Other Findings Past Medical History: No date: HTN (hypertension) Obese.   Reproductive/Obstetrics                            Anesthesia Physical Anesthesia Plan  ASA: III  Anesthesia Plan: General   Post-op Pain Management:    Induction: Intravenous  PONV Risk Score and Plan:   Airway Management Planned: Oral ETT  Additional Equipment:   Intra-op Plan:   Post-operative Plan:   Informed Consent: I have reviewed the patients History and Physical, chart, labs and discussed the procedure including the risks, benefits and alternatives for the proposed anesthesia with the patient or authorized representative who has indicated his/her understanding and acceptance.       Plan Discussed with: CRNA  Anesthesia Plan Comments:         Anesthesia Quick Evaluation

## 2019-07-24 ENCOUNTER — Inpatient Hospital Stay: Payer: Self-pay | Admitting: Certified Registered"

## 2019-07-24 ENCOUNTER — Encounter: Payer: Self-pay | Admitting: Anesthesiology

## 2019-07-24 ENCOUNTER — Encounter
Admission: EM | Disposition: A | Discharge status: Home / Self Care | Payer: Self-pay | Source: Home / Self Care | Attending: Internal Medicine

## 2019-07-24 HISTORY — PX: LAPAROSCOPY: SHX197

## 2019-07-24 LAB — URINALYSIS, ROUTINE W REFLEX MICROSCOPIC
Bacteria, UA: NONE SEEN
Bilirubin Urine: NEGATIVE
Glucose, UA: NEGATIVE mg/dL
Ketones, ur: NEGATIVE mg/dL
Leukocytes,Ua: NEGATIVE
Nitrite: NEGATIVE
Protein, ur: NEGATIVE mg/dL
Specific Gravity, Urine: 1.017 (ref 1.005–1.030)
pH: 5 (ref 5.0–8.0)

## 2019-07-24 LAB — CHLAMYDIA/NGC RT PCR (ARMC ONLY)
Chlamydia Tr: NOT DETECTED
N gonorrhoeae: NOT DETECTED

## 2019-07-24 LAB — CBC
HCT: 35.2 % — ABNORMAL LOW (ref 36.0–46.0)
Hemoglobin: 11.2 g/dL — ABNORMAL LOW (ref 12.0–15.0)
MCH: 25 pg — ABNORMAL LOW (ref 26.0–34.0)
MCHC: 31.8 g/dL (ref 30.0–36.0)
MCV: 78.6 fL — ABNORMAL LOW (ref 80.0–100.0)
Platelets: 345 10*3/uL (ref 150–400)
RBC: 4.48 MIL/uL (ref 3.87–5.11)
RDW: 17.3 % — ABNORMAL HIGH (ref 11.5–15.5)
WBC: 18.6 10*3/uL — ABNORMAL HIGH (ref 4.0–10.5)
nRBC: 0.1 % (ref 0.0–0.2)

## 2019-07-24 LAB — BASIC METABOLIC PANEL
Anion gap: 10 (ref 5–15)
BUN: 11 mg/dL (ref 6–20)
CO2: 25 mmol/L (ref 22–32)
Calcium: 8.4 mg/dL — ABNORMAL LOW (ref 8.9–10.3)
Chloride: 105 mmol/L (ref 98–111)
Creatinine, Ser: 0.96 mg/dL (ref 0.44–1.00)
GFR calc Af Amer: >60 mL/min (ref >60)
GFR calc non Af Amer: >60 mL/min (ref >60)
Glucose, Bld: 88 mg/dL (ref 70–99)
Potassium: 3.3 mmol/L — ABNORMAL LOW (ref 3.5–5.1)
Sodium: 140 mmol/L (ref 135–145)

## 2019-07-24 LAB — MAGNESIUM: Magnesium: 2 mg/dL (ref 1.7–2.4)

## 2019-07-24 LAB — ABO/RH: ABO/RH(D): O POS

## 2019-07-24 SURGERY — LAPAROSCOPY, DIAGNOSTIC
Anesthesia: General

## 2019-07-24 MED ORDER — ACETAMINOPHEN NICU IV SYRINGE 10 MG/ML
INTRAVENOUS | Status: AC
  Filled 2019-07-24: qty 1

## 2019-07-24 MED ORDER — GABAPENTIN 300 MG PO CAPS
900.0000 mg | ORAL_CAPSULE | Freq: Every day | ORAL | Status: AC
  Administered 2019-07-24 – 2019-07-26 (×3): 900 mg via ORAL
  Filled 2019-07-24 (×3): qty 3

## 2019-07-24 MED ORDER — OXYCODONE HCL 5 MG PO TABS: 5.0000 mg | ORAL_TABLET | ORAL | Status: DC | PRN

## 2019-07-24 MED ORDER — LIDOCAINE HCL (CARDIAC) PF 100 MG/5ML IV SOSY
PREFILLED_SYRINGE | INTRAVENOUS | Status: DC | PRN
  Administered 2019-07-24: 100 mg via INTRAVENOUS

## 2019-07-24 MED ORDER — PROPOFOL 10 MG/ML IV BOLUS
INTRAVENOUS | Status: AC
  Filled 2019-07-24: qty 20

## 2019-07-24 MED ORDER — ACETAMINOPHEN 500 MG PO TABS
1000.0000 mg | ORAL_TABLET | Freq: Four times a day (QID) | ORAL | Status: DC
  Administered 2019-07-24 – 2019-07-27 (×11): 1000 mg via ORAL
  Filled 2019-07-24 (×12): qty 2

## 2019-07-24 MED ORDER — SODIUM CHLORIDE 0.9 % IV SOLN
2.0000 g | Freq: Four times a day (QID) | INTRAVENOUS | Status: DC
  Administered 2019-07-24 – 2019-07-27 (×11): 2 g via INTRAVENOUS
  Filled 2019-07-24 (×16): qty 2

## 2019-07-24 MED ORDER — FENTANYL CITRATE (PF) 100 MCG/2ML IJ SOLN
INTRAMUSCULAR | Status: DC | PRN
  Administered 2019-07-24 (×3): 50 ug via INTRAVENOUS
  Administered 2019-07-24 (×2): 25 ug via INTRAVENOUS

## 2019-07-24 MED ORDER — SUCCINYLCHOLINE CHLORIDE 20 MG/ML IJ SOLN
INTRAMUSCULAR | Status: DC | PRN
  Administered 2019-07-24: 120 mg via INTRAVENOUS

## 2019-07-24 MED ORDER — FENTANYL CITRATE (PF) 100 MCG/2ML IJ SOLN
INTRAMUSCULAR | Status: AC
  Filled 2019-07-24: qty 2

## 2019-07-24 MED ORDER — DEXAMETHASONE SODIUM PHOSPHATE 10 MG/ML IJ SOLN
INTRAMUSCULAR | Status: DC | PRN
  Administered 2019-07-24: 10 mg via INTRAVENOUS

## 2019-07-24 MED ORDER — FENTANYL CITRATE (PF) 100 MCG/2ML IJ SOLN: 25.0000 ug | INTRAMUSCULAR | Status: DC | PRN

## 2019-07-24 MED ORDER — IPRATROPIUM-ALBUTEROL 0.5-2.5 (3) MG/3ML IN SOLN
3.0000 mL | Freq: Once | RESPIRATORY_TRACT | Status: AC
  Administered 2019-07-24: 3 mL via RESPIRATORY_TRACT

## 2019-07-24 MED ORDER — MIDAZOLAM HCL 2 MG/2ML IJ SOLN
INTRAMUSCULAR | Status: DC | PRN
  Administered 2019-07-24 (×2): 2 mg via INTRAVENOUS

## 2019-07-24 MED ORDER — SODIUM CHLORIDE 0.9 % IV SOLN
INTRAVENOUS | Status: DC | PRN
  Administered 2019-07-24 – 2019-07-25 (×2): 250 mL via INTRAVENOUS

## 2019-07-24 MED ORDER — ROCURONIUM BROMIDE 100 MG/10ML IV SOLN
INTRAVENOUS | Status: DC | PRN
  Administered 2019-07-24: 20 mg via INTRAVENOUS
  Administered 2019-07-24: 30 mg via INTRAVENOUS

## 2019-07-24 MED ORDER — ACETAMINOPHEN 10 MG/ML IV SOLN
INTRAVENOUS | Status: DC | PRN
  Administered 2019-07-24: 1000 mg via INTRAVENOUS

## 2019-07-24 MED ORDER — SUGAMMADEX SODIUM 200 MG/2ML IV SOLN
INTRAVENOUS | Status: DC | PRN
  Administered 2019-07-24: 200 mg via INTRAVENOUS

## 2019-07-24 MED ORDER — ONDANSETRON HCL 4 MG/2ML IJ SOLN: 4.0000 mg | Freq: Once | INTRAMUSCULAR | Status: DC | PRN

## 2019-07-24 MED ORDER — BUPIVACAINE HCL 0.5 % IJ SOLN
INTRAMUSCULAR | Status: DC | PRN
  Administered 2019-07-24: 12 mL

## 2019-07-24 MED ORDER — IPRATROPIUM-ALBUTEROL 0.5-2.5 (3) MG/3ML IN SOLN
RESPIRATORY_TRACT | Status: AC
  Administered 2019-07-24: 3 mL via RESPIRATORY_TRACT
  Filled 2019-07-24: qty 3

## 2019-07-24 MED ORDER — PHENYLEPHRINE HCL (PRESSORS) 10 MG/ML IV SOLN
INTRAVENOUS | Status: DC | PRN
  Administered 2019-07-24: 100 ug via INTRAVENOUS

## 2019-07-24 MED ORDER — MIDAZOLAM HCL 2 MG/2ML IJ SOLN
INTRAMUSCULAR | Status: AC
  Filled 2019-07-24: qty 2

## 2019-07-24 MED ORDER — POTASSIUM CHLORIDE CRYS ER 20 MEQ PO TBCR
40.0000 meq | EXTENDED_RELEASE_TABLET | Freq: Once | ORAL | Status: AC
  Administered 2019-07-24: 40 meq via ORAL
  Filled 2019-07-24: qty 2

## 2019-07-24 MED ORDER — LACTATED RINGERS IV SOLN
INTRAVENOUS | Status: DC | PRN
  Administered 2019-07-24: 12:00:00 via INTRAVENOUS

## 2019-07-24 SURGICAL SUPPLY — 48 items
BAG URINE DRAINAGE (UROLOGICAL SUPPLIES) ×4 IMPLANT
BLADE SURG SZ11 CARB STEEL (BLADE) ×4 IMPLANT
CATH FOLEY 2WAY  5CC 16FR (CATHETERS) ×2
CATH URTH 16FR FL 2W BLN LF (CATHETERS) ×2 IMPLANT
CHLORAPREP W/TINT 26 (MISCELLANEOUS) ×4 IMPLANT
CLOSURE WOUND 1/4X4 (GAUZE/BANDAGES/DRESSINGS) ×1
COVER WAND RF STERILE (DRAPES) IMPLANT
DERMABOND ADVANCED (GAUZE/BANDAGES/DRESSINGS) ×2
DERMABOND ADVANCED .7 DNX12 (GAUZE/BANDAGES/DRESSINGS) ×2 IMPLANT
DRAPE GENERAL ENDO 106X123.5 (DRAPES) ×2 IMPLANT
DRAPE LEGGINS SURG 28X43 STRL (DRAPES) ×4 IMPLANT
DRAPE STERI POUCH LG 24X46 STR (DRAPES) IMPLANT
DRAPE UNDER BUTTOCK W/FLU (DRAPES) ×4 IMPLANT
GLOVE BIO SURGEON STRL SZ7 (GLOVE) ×8 IMPLANT
GLOVE INDICATOR 7.5 STRL GRN (GLOVE) ×6 IMPLANT
GOWN STRL REUS W/ TWL LRG LVL3 (GOWN DISPOSABLE) ×4 IMPLANT
GOWN STRL REUS W/TWL LRG LVL3 (GOWN DISPOSABLE) ×4
IRRIGATION STRYKERFLOW (MISCELLANEOUS) ×2 IMPLANT
IRRIGATOR STRYKERFLOW (MISCELLANEOUS) ×4
IV NS 1000ML (IV SOLUTION) ×2
IV NS 1000ML BAXH (IV SOLUTION) ×2 IMPLANT
KIT PINK PAD W/HEAD ARE REST (MISCELLANEOUS) ×4
KIT PINK PAD W/HEAD ARM REST (MISCELLANEOUS) ×2 IMPLANT
KIT TURNOVER CYSTO (KITS) ×4 IMPLANT
LABEL OR SOLS (LABEL) ×4 IMPLANT
LIGASURE LAP MARYLAND 5MM 37CM (ELECTROSURGICAL) IMPLANT
LIGASURE VESSEL 5MM BLUNT TIP (ELECTROSURGICAL) IMPLANT
NDL FILTER BLUNT 18X1 1/2 (NEEDLE) ×2 IMPLANT
NEEDLE FILTER BLUNT 18X 1/2SAF (NEEDLE) ×2
NEEDLE FILTER BLUNT 18X1 1/2 (NEEDLE) ×2 IMPLANT
NS IRRIG 500ML POUR BTL (IV SOLUTION) ×4 IMPLANT
PACK GYN LAPAROSCOPIC (MISCELLANEOUS) ×4 IMPLANT
PAD OB MATERNITY 4.3X12.25 (PERSONAL CARE ITEMS) ×4 IMPLANT
PAD PREP 24X41 OB/GYN DISP (PERSONAL CARE ITEMS) ×4 IMPLANT
POUCH SPECIMEN RETRIEVAL 10MM (ENDOMECHANICALS) IMPLANT
SCISSORS METZENBAUM CVD 33 (INSTRUMENTS) IMPLANT
SET TUBE SMOKE EVAC HIGH FLOW (TUBING) ×4 IMPLANT
SLEEVE ENDOPATH XCEL 5M (ENDOMECHANICALS) ×4 IMPLANT
STRIP CLOSURE SKIN 1/4X4 (GAUZE/BANDAGES/DRESSINGS) ×3 IMPLANT
SUT MNCRL AB 4-0 PS2 18 (SUTURE) ×4 IMPLANT
SUT VIC AB 2-0 UR6 27 (SUTURE) ×2 IMPLANT
SUT VIC AB 4-0 SH 27 (SUTURE)
SUT VIC AB 4-0 SH 27XANBCTRL (SUTURE) ×2 IMPLANT
SYR 50ML LL SCALE MARK (SYRINGE) IMPLANT
SYR 5ML LL (SYRINGE) ×4 IMPLANT
TROCAR 5M 150ML BLDLS (TROCAR) ×2 IMPLANT
TROCAR XCEL NON-BLD 5MMX100MML (ENDOMECHANICALS) ×4 IMPLANT
TUBING ART PRESS 48 MALE/FEM (TUBING) IMPLANT

## 2019-07-24 NOTE — Transfer of Care (Signed)
Immediate Anesthesia Transfer of Care Note  Patient: Kristie Santos  Procedure(s) Performed: OPERATIVE LAPAROSCOPY  WITH IRRIGATION AND DEBRIDEMENT OF PELVIC WALL ABSCESS (N/A ) EXAM UNDER ANESTHESIA  Patient Location: PACU  Anesthesia Type:General  Level of Consciousness: awake  Airway & Oxygen Therapy: Patient Spontanous Breathing and Patient connected to face mask oxygen  Post-op Assessment: Report given to RN and Post -op Vital signs reviewed and stable  Post vital signs: Reviewed and stable  Last Vitals:  Vitals Value Taken Time  BP 142/79 07/24/19 1428  Temp 36 C 07/24/19 1428  Pulse 95 07/24/19 1429  Resp 24 07/24/19 1429  SpO2 98 % 07/24/19 1429  Vitals shown include unvalidated device data.  Last Pain:  Vitals:   07/24/19 1145  TempSrc: Oral  PainSc:          Complications: No apparent anesthesia complications

## 2019-07-24 NOTE — Progress Notes (Signed)
Kristie Santos NAME: Kristie Santos    MR#:  [id number]  DATE OF BIRTH:  [birth year]  SUBJECTIVE:  CHIEF COMPLAINT:   Chief Complaint  Patient presents with  . Abdominal Pain  . Diarrhea   The patient has RLQ pain. REVIEW OF SYSTEMS:  Review of Systems  Constitutional: Negative for chills, fever and malaise/fatigue.  HENT: Negative for sore throat.   Eyes: Negative for blurred vision and double vision.  Respiratory: Negative for cough, hemoptysis, shortness of breath, wheezing and stridor.   Cardiovascular: Negative for chest pain, palpitations, orthopnea and leg swelling.  Gastrointestinal: Positive for abdominal pain. Negative for blood in stool, diarrhea, melena, nausea and vomiting.  Genitourinary: Negative for dysuria, flank pain, frequency, hematuria and urgency.  Musculoskeletal: Negative for back pain and joint pain.  Skin: Negative for rash.  Neurological: Negative for dizziness, sensory change, focal weakness, seizures, loss of consciousness, weakness and headaches.  Endo/Heme/Allergies: Negative for polydipsia.  Psychiatric/Behavioral: Negative for depression. The patient is not nervous/anxious.     DRUG ALLERGIES:  No Known Allergies VITALS:  Blood pressure 135/89, pulse 83, temperature (!) 97.1 F (36.2 C), temperature source Oral, resp. rate 18, height 5\' 1"  (1.549 m), weight 106.6 kg, last menstrual period 07/14/2019, SpO2 100 %. PHYSICAL EXAMINATION:  Physical Exam Constitutional:      General: She is not in acute distress.    Appearance: She is obese.  HENT:     Head: Normocephalic.     Mouth/Throat:     Mouth: Mucous membranes are moist.  Eyes:     General: No scleral icterus.    Conjunctiva/sclera: Conjunctivae normal.     Pupils: Pupils are equal, round, and reactive to light.  Neck:     Musculoskeletal: Normal range of motion and neck supple.     Vascular: No JVD.     Trachea: No tracheal  deviation.  Cardiovascular:     Rate and Rhythm: Normal rate and regular rhythm.     Heart sounds: Normal heart sounds. No murmur. No gallop.   Pulmonary:     Effort: Pulmonary effort is normal. No respiratory distress.     Breath sounds: Normal breath sounds. No wheezing or rales.  Abdominal:     General: Bowel sounds are normal. There is no distension.     Palpations: Abdomen is soft.     Tenderness: There is no abdominal tenderness. There is no rebound.  Musculoskeletal: Normal range of motion.        General: No tenderness.  Skin:    Findings: No erythema or rash.  Neurological:     General: No focal deficit present.     Mental Status: She is alert and oriented to person, place, and time.     Cranial Nerves: No cranial nerve deficit.  Psychiatric:        Mood and Affect: Mood normal.    LABORATORY PANEL:  Female CBC Recent Labs  Lab 07/24/19 0504  WBC 18.6*  HGB 11.2*  HCT 35.2*  PLT 345   ------------------------------------------------------------------------------------------------------------------ Chemistries  Recent Labs  Lab 07/19/19 1316  07/24/19 0504  NA 135   < > 140  K 2.9*   < > 3.3*  CL 105   < > 105  CO2 21*   < > 25  GLUCOSE 139*   < > 88  BUN 17   < > 11  CREATININE 1.64*   < > 0.96  CALCIUM 8.5*   < >  8.4*  MG  --    < > 2.0  AST 23  --   --   ALT 23  --   --   ALKPHOS 65  --   --   BILITOT 1.3*  --   --    < > = values in this interval not displayed.   RADIOLOGY:  No results found. ASSESSMENT AND PLAN:   Sepsis ([facility]CC) -due to UTI. Continue IV Rocephin and IV fluid support, follow-up CBC, blood culture is negative so far and urine culture: Multiple species present.  Leukocytosis is worsening. Added doxycycline 100 mg twice daily.  Acute renal failure due to dehydration.  Improved with IV fluid support. Lactic acidosis.  Improved with above treatment. Hypokalemia.  potassium supplement.    HTN (hypertension) -blood pressure is a  little elevated.  Start Lopressor 12.5 mg twice daily. Ovarian cysts.  No ovarian torsion per ultrasound. Per OB/GYN consult, repeat TVUS in am after 48hrs of abx to see if improvement. If no useful info, consider MRI. TVUS: 4.1 x 5.2 x 3.2 cm complex mass/fluid collection in the right adnexa. Per Dr. Dalbert GarnetBeasley, laparoscopic surgery for possible abscess today.  Morbid obesity.  Advised diet control and exercise.  Follow-up PCP.  I discussed with Dr. Dalbert GarnetBeasley. All the records are reviewed and case discussed with Care Management/Social Worker. Management plans discussed with the patient, family and they are in agreement.  CODE STATUS: Full Code   TOTAL TIME TAKING CARE OF THIS PATIENT: 32 minutes.   More than 50% of the time was spent in counseling/coordination of care: YES  POSSIBLE D/C IN 2 DAYS, DEPENDING ON CLINICAL CONDITION.   Shaune PollackQing Analisia Kingsford M.D on 07/24/2019 at 1:46 PM  Between 7am to 6pm - Pager - [phone]  After 6pm go to [email or web address] - Therapist, nutritionalpassword EPAS [email or web address]  [facility]

## 2019-07-24 NOTE — Anesthesia Post-op Follow-up Note (Signed)
Anesthesia QCDR form completed.        

## 2019-07-24 NOTE — Interval H&P Note (Signed)
History and Physical Interval Note:  07/24/2019 12:03 PM  Kristie Santos  has presented today for surgery, with the diagnosis of Pelvic Abscess.  The various methods of treatment have been discussed with the patient and family. After consideration of risks, benefits and other options for treatment, the patient has consented to  Procedure(s): LAPAROSCOPY DIAGNOSTIC WITH IRRIGATION AND DEBRIDEMENT OF PELVIC WALL ABSCESS (N/A) possible right oophorectomy, possible right salpingectomy, any other indicated procedures - as a surgical intervention.  The patient's history has been reviewed, patient examined, no change in status, stable for surgery.  I have reviewed the patient's chart and labs.  Questions were answered to the patient's satisfaction.     Benjaman Kindler

## 2019-07-24 NOTE — Anesthesia Procedure Notes (Signed)
Procedure Name: Intubation Date/Time: 07/24/2019 12:30 PM Performed by: Philbert Riser, CRNA Pre-anesthesia Checklist: Patient identified, Emergency Drugs available, Suction available, Patient being monitored and Timeout performed Patient Re-evaluated:Patient Re-evaluated prior to induction Oxygen Delivery Method: Circle system utilized and Simple face mask Preoxygenation: Pre-oxygenation with 100% oxygen Induction Type: IV induction Ventilation: Mask ventilation without difficulty and Oral airway inserted - appropriate to patient size Laryngoscope Size: Mac and 3 Grade View: Grade I Tube type: Oral Tube size: 7.0 mm Number of attempts: 1 Airway Equipment and Method: Stylet Placement Confirmation: ETT inserted through vocal cords under direct vision,  positive ETCO2 and breath sounds checked- equal and bilateral Secured at: 21 cm Tube secured with: Tape Dental Injury: Teeth and Oropharynx as per pre-operative assessment

## 2019-07-24 NOTE — Plan of Care (Signed)
Vs stable and does take lopressor; scheduled for surgery today at 1145 with Dr. Leafy Ro; consent signed; CHG wipes done; pt NPO since midnight; pre procedure checklist flowsheet started; only need to check about jewelry and dentures/partials; has SL in left inner forearm and it flushes well

## 2019-07-24 NOTE — Progress Notes (Signed)
Pre-procedure checklist complete. Pt transported to OR via bed at this time.     Hilbert Bible, RN

## 2019-07-24 NOTE — Op Note (Addendum)
Kristie Santos PROCEDURE DATE: 07/24/2019  INDICATIONS: Acute pelvic pain  PREOPERATIVE DIAGNOSIS: Adnexal mass, suspected pelvic TOA POSTOPERATIVE DIAGNOSIS: Right pelvic TOA PROCEDURE: Exam under anesthesia, diagnostic laparoscopy, debridement and excision with fluid of right pelvic abscess  SURGEON:  Dr. Benjaman Kindler ASSISTANT: CST, RNFA ANESTHESIOLOGIST: No responsible provider has been recorded for the case. Anesthesiologist: Alphonsus Sias, MD; Gunnar Bulla, MD CRNA: Hedda Slade, CRNA; Philbert Riser, CRNA  INDICATIONS: 43 y.o. para 0 with history of acute-onset pelvic pain with suspected infectious process in right adnexa, now on 4 days of iv abx without significant clinical improvement and with worsening leukocytosis, desiring surgical evaluation.  Her blood cultures are no growth x4 days. WBC today 18.6 from 15.8 and 19.1 on admission. Lactic acid has normalized on ceftriaxone 24hr dosing. Doxycycline 14m BID added yesterday.   Please see preoperative notes for further details.  Risks of surgery were discussed with the patient including but not limited to: bleeding which may require transfusion or reoperation; infection which may require antibiotics; injury to bowel, bladder, ureters or other surrounding organs; need for additional procedures including laparotomy; thromboembolic phenomenon, incisional problems and other postoperative/anesthesia complications. Written informed consent was obtained.    FINDINGS:  Significant blood lining the abdomen with right pelvic adhesions and edmatous tissue incased in omentum along RLQ and right pelvic sidewall. Neither ovary or tube visualized beneath adhesions. Left bowel adhesions and omental edema to left lower quadrant as well. No evidence of infection across upper abdomen, though omental scarring to the RUQ was noted at site of prior lap chole. Fitz-Hugh-Curtis negative.  ANESTHESIA:    General INTRAVENOUS FLUIDS: 800 ml ESTIMATED  BLOOD LOSS: minimal HEMOPERITONEUM: some, but not measurable though mass of omental adhesions URINE OUTPUT: 100 ml SPECIMENS: Right fibrinous tissue along abscess wall COMPLICATIONS: None immediate  PROCEDURE IN DETAIL:  The patient had sequential compression devices applied to her lower extremities while in the preoperative area.  She was then taken to the operating room where general anesthesia was administered and was found to be adequate.  She was placed in the dorsal lithotomy position, and was prepped and draped in a sterile manner.  A Foley catheter was inserted into her bladder and attached to constant drainage and an acorn uterine manipulator was then advanced into the uterus .  After an adequate timeout was performed, attention was turned to the abdomen where an umbilical incision was made with the scalpel.  The Optiview 5-mm trocar and sleeve were then advanced without difficulty with the laparoscope under direct visualization into the abdomen, met with adhesions, and a right upper quadrant port placed on McBurney's point.  The abdomen was then insufflated with carbon dioxide gas and adequate pneumoperitoneum was obtained.   A detailed survey of the patient's pelvis and abdomen revealed the findings as mentioned above. An additional 571mtrocar was placed in the left lower quadrant under direct visualization.  Using the suction device, the adhesions were gently manipulated in right pelvis. Prompt extrusion of pus revealed the abscess, and this area was debrided bluntly and with copious washing fluid.  The pelvis was irrigated and all fluid and blood removed. The patient was placed in reverse Trendelenburg and all fluid removed.  The operative site was surveyed, and it was found to be hemostatic.  No intraoperative injury to surrounding organs was noted.   Pictures were taken of the quadrants and pelvis. The abdomen was desufflated and all instruments were then removed from the patient's  abdomen. The uterine  manipulator was removed without complications.  All incisions were closed with 4-0 Vicryl and Dermabond.   The patient tolerated the procedures well.  All instruments, needles, and sponge counts were correct x 2. The patient was taken to the recovery room in stable condition.

## 2019-07-25 LAB — CULTURE, BLOOD (ROUTINE X 2)
Culture: NO GROWTH
Culture: NO GROWTH
Special Requests: ADEQUATE
Special Requests: ADEQUATE

## 2019-07-25 LAB — CBC
HCT: 35.4 % — ABNORMAL LOW (ref 36.0–46.0)
Hemoglobin: 11 g/dL — ABNORMAL LOW (ref 12.0–15.0)
MCH: 25.1 pg — ABNORMAL LOW (ref 26.0–34.0)
MCHC: 31.1 g/dL (ref 30.0–36.0)
MCV: 80.8 fL (ref 80.0–100.0)
Platelets: 443 10*3/uL — ABNORMAL HIGH (ref 150–400)
RBC: 4.38 MIL/uL (ref 3.87–5.11)
RDW: 17.7 % — ABNORMAL HIGH (ref 11.5–15.5)
WBC: 21.2 10*3/uL — ABNORMAL HIGH (ref 4.0–10.5)
nRBC: 0 % (ref 0.0–0.2)

## 2019-07-25 LAB — BASIC METABOLIC PANEL
Anion gap: 8 (ref 5–15)
BUN: 14 mg/dL (ref 6–20)
CO2: 23 mmol/L (ref 22–32)
Calcium: 8.4 mg/dL — ABNORMAL LOW (ref 8.9–10.3)
Chloride: 106 mmol/L (ref 98–111)
Creatinine, Ser: 1.09 mg/dL — ABNORMAL HIGH (ref 0.44–1.00)
GFR calc Af Amer: >60 mL/min (ref >60)
GFR calc non Af Amer: >60 mL/min (ref >60)
Glucose, Bld: 139 mg/dL — ABNORMAL HIGH (ref 70–99)
Potassium: 3.9 mmol/L (ref 3.5–5.1)
Sodium: 137 mmol/L (ref 135–145)

## 2019-07-25 NOTE — Anesthesia Postprocedure Evaluation (Signed)
Anesthesia Post Note  Patient: Kristie Santos  Procedure(s) Performed: OPERATIVE LAPAROSCOPY  WITH IRRIGATION AND DEBRIDEMENT OF PELVIC WALL ABSCESS (N/A ) EXAM UNDER ANESTHESIA  Patient location during evaluation: PACU Anesthesia Type: General Level of consciousness: awake and alert Pain management: pain level controlled Vital Signs Assessment: post-procedure vital signs reviewed and stable Respiratory status: spontaneous breathing, nonlabored ventilation, respiratory function stable and patient connected to nasal cannula oxygen Cardiovascular status: blood pressure returned to baseline and stable Postop Assessment: no apparent nausea or vomiting Anesthetic complications: no     Last Vitals:  Vitals:   07/25/19 0803 07/25/19 1129  BP: 125/69 117/77  Pulse: 80 88  Resp: 18 18  Temp: 36.7 C 36.7 C  SpO2: 97% 97%    Last Pain:  Vitals:   07/25/19 1129  TempSrc: Oral  PainSc:                  Kristie Santos

## 2019-07-25 NOTE — Progress Notes (Signed)
[facility] [place] [facility]   PATIENT NAME: Kristie Santos    MR#:  [id number]  DATE OF BIRTH:  01/26/1976  SUBJECTIVE:  CHIEF COMPLAINT:   Chief Complaint  Patient presents with  . Abdominal Pain  . Diarrhea   The patient has no complaints. REVIEW OF SYSTEMS:  Review of Systems  Constitutional: Negative for chills, fever and malaise/fatigue.  HENT: Negative for sore throat.   Eyes: Negative for blurred vision and double vision.  Respiratory: Negative for cough, hemoptysis, shortness of breath, wheezing and stridor.   Cardiovascular: Negative for chest pain, palpitations, orthopnea and leg swelling.  Gastrointestinal: Negative for abdominal pain, blood in stool, diarrhea, melena, nausea and vomiting.  Genitourinary: Negative for dysuria, flank pain, frequency, hematuria and urgency.  Musculoskeletal: Negative for back pain and joint pain.  Skin: Negative for rash.  Neurological: Negative for dizziness, sensory change, focal weakness, seizures, loss of consciousness, weakness and headaches.  Endo/Heme/Allergies: Negative for polydipsia.  Psychiatric/Behavioral: Negative for depression. The patient is not nervous/anxious.     DRUG ALLERGIES:  No Known Allergies VITALS:  Blood pressure 117/77, pulse 88, temperature 98 F (36.7 C), temperature source Oral, resp. rate 18, height 5\' 1"  (1.549 m), weight 106.6 kg, last menstrual period 07/14/2019, SpO2 97 %. PHYSICAL EXAMINATION:  Physical Exam Constitutional:      General: She is not in acute distress.    Appearance: She is obese.  HENT:     Head: Normocephalic.     Mouth/Throat:     Mouth: Mucous membranes are moist.  Eyes:     General: No scleral icterus.    Conjunctiva/sclera: Conjunctivae normal.     Pupils: Pupils are equal, round, and reactive to light.  Neck:     Musculoskeletal: Normal range of motion and neck supple.     Vascular: No JVD.     Trachea: No tracheal deviation.   Cardiovascular:     Rate and Rhythm: Normal rate and regular rhythm.     Heart sounds: Normal heart sounds. No murmur. No gallop.   Pulmonary:     Effort: Pulmonary effort is normal. No respiratory distress.     Breath sounds: Normal breath sounds. No wheezing or rales.  Abdominal:     General: Bowel sounds are normal. There is no distension.     Palpations: Abdomen is soft.     Tenderness: There is no abdominal tenderness. There is no rebound.  Musculoskeletal: Normal range of motion.        General: No tenderness.  Skin:    Findings: No erythema or rash.  Neurological:     General: No focal deficit present.     Mental Status: She is alert and oriented to person, place, and time.     Cranial Nerves: No cranial nerve deficit.  Psychiatric:        Mood and Affect: Mood normal.    LABORATORY PANEL:  Female CBC Recent Labs  Lab 07/25/19 0633  WBC 21.2*  HGB 11.0*  HCT 35.4*  PLT 443*   ------------------------------------------------------------------------------------------------------------------ Chemistries  Recent Labs  Lab 07/19/19 1316  07/24/19 0504 07/25/19 0633  NA 135   < > 140 137  K 2.9*   < > 3.3* 3.9  CL 105   < > 105 106  CO2 21*   < > 25 23  GLUCOSE 139*   < > 88 139*  BUN 17   < > 11 14  CREATININE 1.64*   < >  0.96 1.09*  CALCIUM 8.5*   < > 8.4* 8.4*  MG  --    < > 2.0  --   AST 23  --   --   --   ALT 23  --   --   --   ALKPHOS 65  --   --   --   BILITOT 1.3*  --   --   --    < > = values in this interval not displayed.   RADIOLOGY:  No results found. ASSESSMENT AND PLAN:   Sepsis ([place]) -due to UTI. The patient has been treated with IV Rocephin, doxycycline and IV fluid support, follow-up CBC, blood culture is negative so far and urine culture: Multiple species present.  Leukocytosis is worsening.  Acute renal failure due to dehydration.  Improved with IV fluid support. Lactic acidosis.  Improved with above treatment. Hypokalemia.   potassium supplement.    HTN (hypertension) -seems blood pressure was a little elevated.  Started Lopressor 12.5 mg twice daily.  Blood pressure is normal.  Discontinue. Ovarian cysts.  No ovarian torsion per ultrasound. Per OB/GYN consult, repeat TVUS in am after 48hrs of abx to see if improvement. If no useful info, consider MRI. TVUS: 4.1 x 5.2 x 3.2 cm complex mass/fluid collection in the right adnexa. Status supposed to I&D of pelvic wall abscess. Per Dr. Leafy Ro, continue iv abx cefoxitin 2g q6hrs and doxy 100mg  q12 hrs until WBC has normalized. She can be discharged once afebrile x2-3 days with normal WBC and clinical improvement on exam. With a suspected TOA we recommend 3 weeks of po abx at discharge with repeat pelvic ultrasound prior to stopping abx to assess for resolution.  ?Amoxicillin-clavulanate XR (2000 mg extended release orally twice daily).  ?Metronidazole (500 mg orally twice daily) plus (doxycycline 100 mg orally twice daily).  Morbid obesity.  Advised diet control and exercise.  Follow-up PCP.  I discussed with Dr. Leafy Ro. All the records are reviewed and case discussed with Care Management/Social Worker. Management plans discussed with the patient, family and they are in agreement.  CODE STATUS: Full Code   TOTAL TIME TAKING CARE OF THIS PATIENT: 32 minutes.   More than 50% of the time was spent in counseling/coordination of care: YES  POSSIBLE D/C IN 2 DAYS, DEPENDING ON CLINICAL CONDITION.   Demetrios Loll M.D on 07/25/2019 at 11:43 AM  Between 7am to 6pm - Pager - [phone]  After 6pm go to [email or web address] - [facility]

## 2019-07-25 NOTE — Progress Notes (Signed)
1 Day Post-Op       Procedure(s): OPERATIVE LAPAROSCOPY  WITH IRRIGATION AND DEBRIDEMENT OF PELVIC WALL ABSCESS (N/A) EXAM UNDER ANESTHESIA   Subjective: The patient is doing well.  No nausea or vomiting. Pain has improved since prior to surgery. She is ambulating and eating.  Objective: Vital signs in last 24 hours: Temp:  [96.8 F (36 C)-98 F (36.7 C)] 98 F (36.7 C) (07/31 0803) Pulse Rate:  [76-101] 80 (07/31 0803) Resp:  [15-22] 18 (07/31 0803) BP: (118-142)/(69-94) 125/69 (07/31 0803) SpO2:  [89 %-100 %] 97 % (07/31 0803)  Intake/Output  Intake/Output Summary (Last 24 hours) at 07/25/2019 1020 Last data filed at 07/24/2019 1914 Gross per 24 hour  Intake 1019.98 ml  Output 110 ml  Net 909.98 ml    Physical Exam:  General: Alert and oriented. CV: RRR Lungs: Clear bilaterally. GI: Soft, Nondistended. Incisions: Clean and dry. Extremities: Nontender, no erythema, no edema.  Lab Results: Recent Labs    07/23/19 0300 07/24/19 0504 07/25/19 0633  HGB 10.6* 11.2* 11.0*  HCT 33.0* 35.2* 35.4*  WBC 15.8* 18.6* 21.2*  PLT 321 345 443*                 Results for orders placed or performed during the hospital encounter of 07/19/19 (from the past 24 hour(s))  Chlamydia/NGC rt PCR ([facility] only)     Status: None   Collection Time: 07/24/19  1:31 PM   Specimen: Cervical/Vaginal swab; Genital  Result Value Ref Range   Specimen source GC/Chlam ENDOCERVICAL    Chlamydia Tr NOT DETECTED NOT DETECTED   N gonorrhoeae NOT DETECTED NOT DETECTED  Urinalysis, Routine w reflex microscopic     Status: Abnormal   Collection Time: 07/24/19  1:42 PM  Result Value Ref Range   Color, Urine YELLOW (A) YELLOW   APPearance CLEAR (A) CLEAR   Specific Gravity, Urine 1.017 1.005 - 1.030   pH 5.0 5.0 - 8.0   Glucose, UA NEGATIVE NEGATIVE mg/dL   Hgb urine dipstick SMALL (A) NEGATIVE   Bilirubin Urine NEGATIVE NEGATIVE   Ketones, ur NEGATIVE NEGATIVE mg/dL   Protein, ur NEGATIVE  NEGATIVE mg/dL   Nitrite NEGATIVE NEGATIVE   Leukocytes,Ua NEGATIVE NEGATIVE   RBC / HPF 6-10 0 - 5 RBC/hpf   WBC, UA 0-5 0 - 5 WBC/hpf   Bacteria, UA NONE SEEN NONE SEEN   Squamous Epithelial / LPF 0-5 0 - 5   Mucus PRESENT   CBC     Status: Abnormal   Collection Time: 07/25/19  6:33 AM  Result Value Ref Range   WBC 21.2 (H) 4.0 - 10.5 K/uL   RBC 4.38 3.87 - 5.11 MIL/uL   Hemoglobin 11.0 (L) 12.0 - 15.0 g/dL   HCT 35.4 (L) 36.0 - 46.0 %   MCV 80.8 80.0 - 100.0 fL   MCH 25.1 (L) 26.0 - 34.0 pg   MCHC 31.1 30.0 - 36.0 g/dL   RDW 17.7 (H) 11.5 - 15.5 %   Platelets 443 (H) 150 - 400 K/uL   nRBC 0.0 0.0 - 0.2 %  Basic metabolic panel     Status: Abnormal   Collection Time: 07/25/19  6:33 AM  Result Value Ref Range   Sodium 137 135 - 145 mmol/L   Potassium 3.9 3.5 - 5.1 mmol/L   Chloride 106 98 - 111 mmol/L   CO2 23 22 - 32 mmol/L   Glucose, Bld 139 (H) 70 - 99 mg/dL   BUN 14  6 - 20 mg/dL   Creatinine, Ser 1.611.09 (H) 0.44 - 1.00 mg/dL   Calcium 8.4 (L) 8.9 - 10.3 mg/dL   GFR calc non Af Amer >60 >60 mL/min   GFR calc Af Amer >60 >60 mL/min   Anion gap 8 5 - 15    Assessment/Plan: 1 Day Post-Op       Procedure(s): OPERATIVE LAPAROSCOPY  WITH IRRIGATION AND DEBRIDEMENT OF PELVIC WALL ABSCESS (N/A) EXAM UNDER ANESTHESIA  1) leukocytosis: expected after surgical abscess washout. Continue iv abx and monitoring for improvement 2) Postop: advance diet as tolerated, encourage ambulation, restart anticoagulation, pulmonary hygiene  3) ID: currently afebrile. Continue iv abx cefoxitin 2g q6hrs and doxy 100mg  q12 hrs until WBC has normalized. She can be discharged once afebrile x2-3 days with normal WBC and clinical improvement on exam.   With a suspected TOA we recommend 3 weeks of po abx at discharge with repeat pelvic ultrasound prior to stopping abx to assess for resolution. She can f/u in our office for this ultrasound with a visit to make further f/u recommendations dependent on  clinical course at that time. Abx regimens include:  ?Amoxicillin-clavulanate XR (2000 mg extended release orally twice daily).  ?Metronidazole (500 mg orally twice daily) plus (doxycycline 100 mg orally twice daily).   Christeen DouglasBethany Lindyn Vossler, MD   LOS: 6 days   Christeen DouglasBethany Lauramae Kneisley 07/25/2019, 10:20 AM

## 2019-07-26 LAB — CBC
HCT: 33.8 % — ABNORMAL LOW (ref 36.0–46.0)
Hemoglobin: 10.6 g/dL — ABNORMAL LOW (ref 12.0–15.0)
MCH: 25.2 pg — ABNORMAL LOW (ref 26.0–34.0)
MCHC: 31.4 g/dL (ref 30.0–36.0)
MCV: 80.5 fL (ref 80.0–100.0)
Platelets: 490 10*3/uL — ABNORMAL HIGH (ref 150–400)
RBC: 4.2 MIL/uL (ref 3.87–5.11)
RDW: 17.9 % — ABNORMAL HIGH (ref 11.5–15.5)
WBC: 18.6 10*3/uL — ABNORMAL HIGH (ref 4.0–10.5)
nRBC: 0.1 % (ref 0.0–0.2)

## 2019-07-26 LAB — CREATININE, SERUM
Creatinine, Ser: 0.95 mg/dL (ref 0.44–1.00)
GFR calc Af Amer: >60 mL/min (ref >60)
GFR calc non Af Amer: >60 mL/min (ref >60)

## 2019-07-26 NOTE — Progress Notes (Signed)
[facility] - [place] [facility]   PATIENT NAME: Kristie DresserKristy Santos    MR#:  [id number]  DATE OF BIRTH:  10/11/1976  SUBJECTIVE:  CHIEF COMPLAINT:   Chief Complaint  Patient presents with  . Abdominal Pain  . Diarrhea   Patient has been ambulating.  Tolerating diet.  No significant pain.  Had a bowel movement earlier in the day. REVIEW OF SYSTEMS:  Review of Systems  Constitutional: Negative for chills, fever and malaise/fatigue.  HENT: Negative for sore throat.   Eyes: Negative for blurred vision and double vision.  Respiratory: Negative for cough, hemoptysis, shortness of breath, wheezing and stridor.   Cardiovascular: Negative for chest pain, palpitations, orthopnea and leg swelling.  Gastrointestinal: Negative for abdominal pain, blood in stool, diarrhea, melena, nausea and vomiting.  Genitourinary: Negative for dysuria, flank pain, frequency, hematuria and urgency.  Musculoskeletal: Negative for back pain and joint pain.  Skin: Negative for rash.  Neurological: Negative for dizziness, sensory change, focal weakness, seizures, loss of consciousness, weakness and headaches.  Endo/Heme/Allergies: Negative for polydipsia.  Psychiatric/Behavioral: Negative for depression. The patient is not nervous/anxious.    DRUG ALLERGIES:  No Known Allergies VITALS:  Blood pressure 132/75, pulse (!) 107, temperature 98.7 F (37.1 C), temperature source Oral, resp. rate 18, height 5\' 1"  (1.549 m), weight 106.6 kg, last menstrual period 07/14/2019, SpO2 96 %. PHYSICAL EXAMINATION:  Physical Exam Constitutional:      General: She is not in acute distress.    Appearance: She is obese.  HENT:     Head: Normocephalic.     Mouth/Throat:     Mouth: Mucous membranes are moist.  Eyes:     General: No scleral icterus.    Conjunctiva/sclera: Conjunctivae normal.     Pupils: Pupils are equal, round, and reactive to light.  Neck:     Musculoskeletal: Normal range of motion  and neck supple.     Vascular: No JVD.     Trachea: No tracheal deviation.  Cardiovascular:     Rate and Rhythm: Normal rate and regular rhythm.     Heart sounds: Normal heart sounds. No murmur. No gallop.   Pulmonary:     Effort: Pulmonary effort is normal. No respiratory distress.     Breath sounds: Normal breath sounds. No wheezing or rales.  Abdominal:     General: Bowel sounds are normal. There is no distension.     Palpations: Abdomen is soft.     Tenderness: There is no abdominal tenderness. There is no rebound.  Musculoskeletal: Normal range of motion.        General: No tenderness.  Skin:    Findings: No erythema or rash.  Neurological:     General: No focal deficit present.     Mental Status: She is alert and oriented to person, place, and time.     Cranial Nerves: No cranial nerve deficit.  Psychiatric:        Mood and Affect: Mood normal.    LABORATORY PANEL:  Female CBC Recent Labs  Lab 07/26/19 0617  WBC 18.6*  HGB 10.6*  HCT 33.8*  PLT 490*   ------------------------------------------------------------------------------------------------------------------ Chemistries  Recent Labs  Lab 07/19/19 1316  07/24/19 0504 07/25/19 0633 07/26/19 0617  NA 135   < > 140 137  --   K 2.9*   < > 3.3* 3.9  --   CL 105   < > 105 106  --   CO2 21*   < > 25  23  --   GLUCOSE 139*   < > 88 139*  --   BUN 17   < > 11 14  --   CREATININE 1.64*   < > 0.96 1.09* 0.95  CALCIUM 8.5*   < > 8.4* 8.4*  --   MG  --    < > 2.0  --   --   AST 23  --   --   --   --   ALT 23  --   --   --   --   ALKPHOS 65  --   --   --   --   BILITOT 1.3*  --   --   --   --    < > = values in this interval not displayed.   RADIOLOGY:  No results found. ASSESSMENT AND PLAN:   Sepsis due to pelvic abscess and tubo-ovarian abscess Initially patient was treated for possible UTI.  Cultures have been negative. S/p Diagnostic laparoscopy, debridement and excision with fluid of right pelvic  abscess  POD 1 continue iv abx cefoxitin 2g q6hrs and doxy 100mg  q12 hrs until WBC has normalized. 3 weeks of po abx at discharge with repeat pelvic ultrasound prior to stopping abx to assess for resolution.  Augmentin at discharge - 3 weeks  *Acute kidney injury secondary to sepsis and dehydration.  Resolved  * Hypokalemia.  Replaced    HTN (hypertension) -seems blood pressure was a little elevated.  Started Lopressor 12.5 mg twice daily.  Blood pressure is normal.  Discontinued  Morbid obesity.  Advised diet control and exercise.  Follow-up PCP.  All the records are reviewed and case discussed with Care Management/Social Worker. Management plans discussed with the patient, family and they are in agreement.  CODE STATUS: Full Code   TOTAL TIME TAKING CARE OF THIS PATIENT: 30 minutes.   POSSIBLE D/C IN 1-2 DAYS, DEPENDING ON CLINICAL CONDITION.   Leia Alf Adriane Guglielmo M.D on 07/26/2019 at 1:12 PM  Between 7am to 6pm - Pager - [phone]  After 6pm go to [email or web address] - [facility]

## 2019-07-27 LAB — CBC
HCT: 33.3 % — ABNORMAL LOW (ref 36.0–46.0)
Hemoglobin: 10.7 g/dL — ABNORMAL LOW (ref 12.0–15.0)
MCH: 25.2 pg — ABNORMAL LOW (ref 26.0–34.0)
MCHC: 32.1 g/dL (ref 30.0–36.0)
MCV: 78.5 fL — ABNORMAL LOW (ref 80.0–100.0)
Platelets: 572 10*3/uL — ABNORMAL HIGH (ref 150–400)
RBC: 4.24 MIL/uL (ref 3.87–5.11)
RDW: 18 % — ABNORMAL HIGH (ref 11.5–15.5)
WBC: 15.9 10*3/uL — ABNORMAL HIGH (ref 4.0–10.5)
nRBC: 0.1 % (ref 0.0–0.2)

## 2019-07-27 MED ORDER — AMOXICILLIN-POT CLAVULANATE 875-125 MG PO TABS: 1.0000 | ORAL_TABLET | Freq: Two times a day (BID) | ORAL | 0 refills | Status: AC

## 2019-07-27 NOTE — Progress Notes (Signed)
Discharge instructions complete and prescription sent to pharmacy by provider, Dr. Darvin Neighbours. Patient verbalizes understanding of teaching. Work note printed by provider and given to patient. RN asked patient about her ride home. Patient states she will be driving herself home. RN explained that it is recommended that patient have a driver for safety reasons. Patient states her family lives over an hour away and cannot pick her up. RN offered to call care management to help find patient a ride home or to get a taxi voucher. Patient declines stating "I drove myself here and I'm going to drive myself home." RN discussed safety concerns again and when to stop driving. Patient verbalizes understanding. Patient discharged home at 1200.

## 2019-07-27 NOTE — Progress Notes (Signed)
Patient doing well this morning. Vitals stable and WDL. Patient states she has no pain. Patient taking scheduled tylenol. Patient is ambulating independently, voiding adequately, and tolerating PO foods/fluids well. Incision sites WDL. Patient has no vaginal bleeding and states she is ready for discharge. RN to continue to monitor.

## 2019-07-28 LAB — SURGICAL PATHOLOGY

## 2019-07-28 LAB — CYTOLOGY - NON PAP

## 2019-07-31 NOTE — Discharge Summary (Signed)
[facility] - [place] [facility]   PATIENT NAME: Kristie Santos    MR#:  [id number]  DATE OF BIRTH:  08/30/1976  DATE OF ADMISSION:  07/19/2019 ADMITTING PHYSICIAN: Oralia Manisavid Willis, MD  DATE OF DISCHARGE: 07/27/2019 12:00 PM  PRIMARY CARE PHYSICIAN: Patient, No Pcp Per   ADMISSION DIAGNOSIS:  Diarrhea of presumed infectious origin [R19.7] Right ovarian cyst [N83.201] Urinary tract infection without hematuria, site unspecified [N39.0]  DISCHARGE DIAGNOSIS:  Principal Problem:   Sepsis (HCC) Active Problems:   UTI (urinary tract infection)   HTN (hypertension)   SECONDARY DIAGNOSIS:   Past Medical History:  Diagnosis Date  . HTN (hypertension)      ADMITTING HISTORY  HISTORY OF PRESENT ILLNESS:  Kristie Santos  is a 43 y.o. female who presents with chief complaint as above.  Patient presents the ED with a complaint of abdominal pain and diarrhea.  She states is been going on overall for about a month now.  She says she was seen a few times in the interim.  At some point she was placed on amoxicillin.  On evaluation here tonight she appears to have a UTI based on her urinalysis results.  She has a significant leukocytosis as well.  On chart review it seems like she likely had this UTI previously as well.  C. difficile studies and GI panel in the ED were all negative.  Hospitalist were called for admission   HOSPITAL COURSE:   Sepsis due to pelvic abscess and tubo-ovarian abscess Initially patient was treated for possible UTI.  Cultures have been negative. S/p Diagnostic laparoscopy,debridement and excision with fluid of right pelvic abscess POD - 3 Continued on iv abx cefoxitin 2g q6hrs and doxy 100mg  q12 hrs. Wbc trending down 3 weeks of po abx at discharge with repeat pelvic ultrasound prior to stopping abx to assess for resolution.  Augmentin at discharge - 3 weeks prescription given/ F/U with Dr. Dalbert GarnetBeasley as OP  *Acute kidney injury secondary to  sepsis and dehydration.  Resolved  * Hypokalemia.  Replaced  HTN (hypertension) -blood pressure was a little elevated initially but normalized.   Blood pressure was normal.  Discontinued  Morbid obesity.  Advised diet control and exercise.  Follow-up PCP.  Stable for discharge home. Afebrile. WBC trending down. No pain. Ambulating and on regular diet  CONSULTS OBTAINED:  Gynecology   DRUG ALLERGIES:  No Known Allergies  DISCHARGE MEDICATIONS:   Allergies as of 07/27/2019   No Known Allergies     Medication List    TAKE these medications   acetaminophen 325 MG tablet Commonly known as: TYLENOL Take 650 mg by mouth every 6 (six) hours as needed.   amoxicillin-clavulanate 875-125 MG tablet Commonly known as: Augmentin Take 1 tablet by mouth 2 (two) times daily for 21 days.       Today   VITAL SIGNS:  Blood pressure 137/88, pulse 91, temperature 98 F (36.7 C), temperature source Oral, resp. rate 18, height 5\' 1"  (1.549 m), weight 106.6 kg, last menstrual period 07/14/2019, SpO2 95 %.  I/O:  No intake or output data in the 24 hours ending 07/31/19 1718  PHYSICAL EXAMINATION:  Physical Exam  GENERAL:  43 y.o.-year-old patient lying in the bed with no acute distress.  LUNGS: Normal breath sounds bilaterally, no wheezing, rales,rhonchi or crepitation. No use of accessory muscles of respiration.  CARDIOVASCULAR: S1, S2 normal. No murmurs, rubs, or gallops.  ABDOMEN: Soft, non-tender, non-distended. Bowel sounds present. No organomegaly or mass.  NEUROLOGIC: Moves all 4 extremities. PSYCHIATRIC: The patient is alert and oriented x 3.  SKIN: No obvious rash, lesion, or ulcer.   DATA REVIEW:   CBC Recent Labs  Lab 07/27/19 0528  WBC 15.9*  HGB 10.7*  HCT 33.3*  PLT 572*    Chemistries  Recent Labs  Lab 07/25/19 0633 07/26/19 0617  NA 137  --   K 3.9  --   CL 106  --   CO2 23  --   GLUCOSE 139*  --   BUN 14  --   CREATININE 1.09* 0.95  CALCIUM  8.4*  --     Cardiac Enzymes No results for input(s): TROPONINI in the last 168 hours.  Microbiology Results  Results for orders placed or performed during the hospital encounter of 07/19/19  SARS Coronavirus 2 (CEPHEID - Performed in [facility] hospital lab), Hosp Order     Status: None   Collection Time: 07/19/19  1:16 PM   Specimen: Nasopharyngeal Swab  Result Value Ref Range Status   SARS Coronavirus 2 NEGATIVE NEGATIVE Final    Comment: (NOTE) If result is NEGATIVE SARS-CoV-2 target nucleic acids are NOT DETECTED. The SARS-CoV-2 RNA is generally detectable in upper and lower  respiratory specimens during the acute phase of infection. The lowest  concentration of SARS-CoV-2 viral copies this assay can detect is 250  copies / mL. A negative result does not preclude SARS-CoV-2 infection  and should not be used as the sole basis for treatment or other  patient management decisions.  A negative result may occur with  improper specimen collection / handling, submission of specimen other  than nasopharyngeal swab, presence of viral mutation(s) within the  areas targeted by this assay, and inadequate number of viral copies  (<250 copies / mL). A negative result must be combined with clinical  observations, patient history, and epidemiological information. If result is POSITIVE SARS-CoV-2 target nucleic acids are DETECTED. The SARS-CoV-2 RNA is generally detectable in upper and lower  respiratory specimens dur ing the acute phase of infection.  Positive  results are indicative of active infection with SARS-CoV-2.  Clinical  correlation with patient history and other diagnostic information is  necessary to determine patient infection status.  Positive results do  not rule out bacterial infection or co-infection with other viruses. If result is PRESUMPTIVE POSTIVE SARS-CoV-2 nucleic acids MAY BE PRESENT.   A presumptive positive result was obtained on the submitted specimen  and  confirmed on repeat testing.  While 2019 novel coronavirus  (SARS-CoV-2) nucleic acids may be present in the submitted sample  additional confirmatory testing may be necessary for epidemiological  and / or clinical management purposes  to differentiate between  SARS-CoV-2 and other Sarbecovirus currently known to infect humans.  If clinically indicated additional testing with an alternate test  methodology [phone]) is advised. The SARS-CoV-2 RNA is generally  detectable in upper and lower respiratory sp ecimens during the acute  phase of infection. The expected result is Negative. Fact Sheet for Patients:  StrictlyIdeas.no Fact Sheet for Healthcare Providers: BankingDealers.co.za This test is not yet approved or cleared by the [place] FDA and has been authorized for detection and/or diagnosis of SARS-CoV-2 by FDA under an Emergency Use Authorization (EUA).  This EUA will remain in effect (meaning this test can be used) for the duration of the COVID-19 declaration under Section 564(b)(1) of the Act, 21 U.S.C. section 360bbb-3(b)(1), unless the authorization is terminated or revoked sooner. Performed at [facility]  Lab, [address] [place], [place] [address]   Urine Culture     Status: Abnormal   Collection Time: 07/19/19  1:16 PM   Specimen: Urine, Random  Result Value Ref Range Status   Specimen Description   Final    URINE, RANDOM Performed at [facility], [address] [place], [place] [address]    Special Requests   Final    NONE Performed at [facility], [address] [place], [place] [address]    Culture MULTIPLE SPECIES PRESENT, SUGGEST RECOLLECTION (A)  Final   Report Status 07/21/2019 FINAL  Final  Gastrointestinal Panel by PCR , Stool     Status: None   Collection Time: 07/19/19  2:28 PM   Specimen: Stool  Result Value Ref Range Status   Campylobacter species NOT DETECTED NOT  DETECTED Final   Plesimonas shigelloides NOT DETECTED NOT DETECTED Final   Salmonella species NOT DETECTED NOT DETECTED Final   Yersinia enterocolitica NOT DETECTED NOT DETECTED Final   Vibrio species NOT DETECTED NOT DETECTED Final   Vibrio cholerae NOT DETECTED NOT DETECTED Final   Enteroaggregative E coli (EAEC) NOT DETECTED NOT DETECTED Final   Enteropathogenic E coli (EPEC) NOT DETECTED NOT DETECTED Final   Enterotoxigenic E coli (ETEC) NOT DETECTED NOT DETECTED Final   Shiga like toxin producing E coli (STEC) NOT DETECTED NOT DETECTED Final   Shigella/Enteroinvasive E coli (EIEC) NOT DETECTED NOT DETECTED Final   Cryptosporidium NOT DETECTED NOT DETECTED Final   Cyclospora cayetanensis NOT DETECTED NOT DETECTED Final   Entamoeba histolytica NOT DETECTED NOT DETECTED Final   Giardia lamblia NOT DETECTED NOT DETECTED Final   Adenovirus F40/41 NOT DETECTED NOT DETECTED Final   Astrovirus NOT DETECTED NOT DETECTED Final   Norovirus GI/GII NOT DETECTED NOT DETECTED Final   Rotavirus A NOT DETECTED NOT DETECTED Final   Sapovirus (I, II, IV, and V) NOT DETECTED NOT DETECTED Final    Comment: Performed at [facility], [address] [place], [place] [address]  C difficile quick scan w PCR reflex     Status: None   Collection Time: 07/19/19  2:28 PM   Specimen: Stool  Result Value Ref Range Status   C Diff antigen NEGATIVE NEGATIVE Final   C Diff toxin NEGATIVE NEGATIVE Final   C Diff interpretation No C. difficile detected.  Final    Comment: Performed at [facility], [address] [place], [place] [address]  Culture, blood (Routine X 2) w Reflex to ID Panel     Status: None   Collection Time: 07/20/19  2:00 AM   Specimen: BLOOD  Result Value Ref Range Status   Specimen Description BLOOD RIGHT HAND RADIAL  Final   Special Requests   Final    BOTTLES DRAWN AEROBIC AND ANAEROBIC Blood Culture adequate volume   Culture   Final    NO GROWTH 5 DAYS Performed at  [facility], [address] [place], [place] [address]    Report Status 07/25/2019 FINAL  Final  Culture, blood (Routine X 2) w Reflex to ID Panel     Status: None   Collection Time: 07/20/19  2:03 AM   Specimen: BLOOD  Result Value Ref Range Status   Specimen Description BLOOD RIGHT HAND ULNAR  Final   Special Requests   Final    BOTTLES DRAWN AEROBIC AND ANAEROBIC Blood Culture adequate volume   Culture   Final    NO GROWTH 5 DAYS Performed  at [facility], [address] [place], [place] [address]    Report Status 07/25/2019 FINAL  Final  Chlamydia/NGC rt PCR ([facility] only)     Status: None   Collection Time: 07/24/19  1:31 PM   Specimen: Cervical/Vaginal swab; Genital  Result Value Ref Range Status   Specimen source GC/Chlam ENDOCERVICAL  Final   Chlamydia Tr NOT DETECTED NOT DETECTED Final   N gonorrhoeae NOT DETECTED NOT DETECTED Final    Comment: (NOTE) This CT/NG assay has not been evaluated in patients with a history of  hysterectomy. Performed at [facility], [address] [place], [place] [address]     RADIOLOGY:  No results found.  Follow up with PCP in 1 week.  Management plans discussed with the patient, family and they are in agreement.  CODE STATUS:  Code Status History    Date Active Date Inactive Code Status Order ID Comments User Context   07/20/2019 0119 07/27/2019 1808 Full Code [id number]  Oralia ManisWillis, David, MD Inpatient   Advance Care Planning Activity      TOTAL TIME TAKING CARE OF THIS PATIENT ON DAY OF DISCHARGE: more than 30 minutes.   Molinda BailiffSrikar R Cobie Marcoux M.D on 07/31/2019 at 5:18 PM  Between 7am to 6pm - Pager - [phone]  After 6pm go to [email or web address] - password [email or web address]  [facility]  Office  [phone]  CC: Primary care physician; Patient, No Pcp Per  Note: This dictation was prepared with Dragon dictation along with smaller phrase technology. Any transcriptional errors that result from  this process are unintentional.

## 2019-10-16 ENCOUNTER — Encounter: Payer: Self-pay | Admitting: Obstetrics and Gynecology

## 2020-05-06 ENCOUNTER — Other Ambulatory Visit: Payer: Self-pay | Admitting: Family Medicine

## 2020-05-06 DIAGNOSIS — Z8543 Personal history of malignant neoplasm of ovary: Secondary | ICD-10-CM

## 2020-05-06 DIAGNOSIS — N926 Irregular menstruation, unspecified: Secondary | ICD-10-CM

## 2020-05-17 ENCOUNTER — Other Ambulatory Visit: Payer: Self-pay

## 2020-05-28 ENCOUNTER — Ambulatory Visit
Admission: RE | Admit: 2020-05-28 | Discharge: 2020-05-28 | Disposition: A | Discharge status: Home / Self Care | Payer: Medicaid Other | Source: Ambulatory Visit | Attending: Family Medicine | Admitting: Family Medicine

## 2020-05-28 DIAGNOSIS — N926 Irregular menstruation, unspecified: Secondary | ICD-10-CM

## 2020-05-28 DIAGNOSIS — Z8543 Personal history of malignant neoplasm of ovary: Secondary | ICD-10-CM

## 2020-08-24 ENCOUNTER — Encounter: Payer: Self-pay | Admitting: *Deleted

## 2020-09-01 ENCOUNTER — Encounter: Payer: Medicaid Other | Admitting: Obstetrics and Gynecology

## 2020-09-13 ENCOUNTER — Ambulatory Visit (INDEPENDENT_AMBULATORY_CARE_PROVIDER_SITE_OTHER): Payer: BLUE CROSS/BLUE SHIELD | Admitting: Obstetrics and Gynecology

## 2020-09-13 ENCOUNTER — Encounter: Payer: Self-pay | Admitting: Obstetrics and Gynecology

## 2020-09-13 ENCOUNTER — Other Ambulatory Visit: Payer: Self-pay

## 2020-09-13 DIAGNOSIS — N83209 Unspecified ovarian cyst, unspecified side: Secondary | ICD-10-CM | POA: Insufficient documentation

## 2020-09-13 DIAGNOSIS — N83201 Unspecified ovarian cyst, right side: Secondary | ICD-10-CM | POA: Diagnosis not present

## 2020-09-13 NOTE — Patient Instructions (Signed)
Ovarian Cyst An ovarian cyst is a fluid-filled sac on an ovary. The ovaries are organs that make eggs in women. Most ovarian cysts go away on their own and are not cancerous (are benign). Some cysts need treatment. Follow these instructions at home:  Take over-the-counter and prescription medicines only as told by your doctor.  Do not drive or use heavy machinery while taking prescription pain medicine.  Get pelvic exams and Pap tests as often as told by your doctor.  Return to your normal activities as told by your doctor. Ask your doctor what activities are safe for you.  Do not use any products that contain nicotine or tobacco, such as cigarettes and e-cigarettes. If you need help quitting, ask your doctor.  Keep all follow-up visits as told by your doctor. This is important. Contact a doctor if:  Your periods are: ? Late. ? Irregular. ? Painful.   Your periods stop.  You have pelvic pain that does not go away.  You have pressure on your bladder.  You have trouble making your bladder empty when you pee (urinate).  You have pain during sex.  You have any of the following in your belly (abdomen): ? A feeling of fullness. ? Pressure. ? Discomfort. ? Pain that does not go away. ? Swelling.  You feel sick most of the time.  You have trouble pooping (have constipation).  You are not as hungry as usual (you lose your appetite).  You get very bad acne.  You start to have more hair on your body and face.  You are gaining weight or losing weight without changing your exercise and eating habits.  You think you may be pregnant. Get help right away if:  You have belly pain that is very bad or gets worse.  You cannot eat or drink without throwing up (vomiting).  You suddenly get a fever.  Your period is a lot heavier than usual. This information is not intended to replace advice given to you by your health care provider. Make sure you discuss any questions you have  with your health care provider. Document Revised: 11/23/2017 Document Reviewed: 05/14/2016 Elsevier Patient Education  2020 Elsevier Inc.  

## 2020-09-13 NOTE — Progress Notes (Signed)
Subjective:    Patient ID: Kristie Santos, female    DOB: 06-30-1976, 44 y.o.   MRN: [id number]  HPI  44 yo [id number] seen in [facility] office for follow up on ovarian cyst.  Pt had a TOA treated with antibiotics and laparoscopic drainage 07/24/2019.  She has had a follow up u/s 05/28/2020 which showed a 3.8 cm right adnexal collection.  I am unsure who ordered the scan and for what reason as there does not appear to be any notes associated with the scan.  Pt denies any abdominal or pelvic pain since her procedure.  Review of the op note showed substantial scarring and adhesions to the right sidewall and bowel adhesions to the omentum    Review of Systems  All other systems reviewed and are negative.  Past Surgical History:  Procedure Laterality Date  . CHOLECYSTECTOMY    . LAPAROSCOPY N/A 07/24/2019   Procedure: OPERATIVE LAPAROSCOPY  WITH IRRIGATION AND DEBRIDEMENT OF PELVIC WALL ABSCESS;  Surgeon: Christeen Douglas, MD;  Location: [facility] ORS;  Service: Gynecology;  Laterality: N/A;  . TONSILLECTOMY     Past Medical History:  Diagnosis Date  . HTN (hypertension)    Social History   Socioeconomic History  . Marital status: Single    Spouse name: Not on file  . Number of children: Not on file  . Years of education: Not on file  . Highest education level: Not on file  Occupational History  . Not on file  Tobacco Use  . Smoking status: Never Smoker  . Smokeless tobacco: Never Used  Substance and Sexual Activity  . Alcohol use: No  . Drug use: No  . Sexual activity: Not on file  Other Topics Concern  . Not on file  Social History Narrative  . Not on file   Social Determinants of Health   Financial Resource Strain:   . Difficulty of Paying Living Expenses: Not on file  Food Insecurity:   . Worried About Programme researcher, broadcasting/film/video in the Last Year: Not on file  . Ran Out of Food in the Last Year: Not on file  Transportation Needs:   . Lack of Transportation (Medical): Not on file  .  Lack of Transportation (Non-Medical): Not on file  Physical Activity:   . Days of Exercise per Week: Not on file  . Minutes of Exercise per Session: Not on file  Stress:   . Feeling of Stress : Not on file  Social Connections:   . Frequency of Communication with Friends and Family: Not on file  . Frequency of Social Gatherings with Friends and Family: Not on file  . Attends Religious Services: Not on file  . Active Member of Clubs or Organizations: Not on file  . Attends Banker Meetings: Not on file  . Marital Status: Not on file  Intimate Partner Violence:   . Fear of Current or Ex-Partner: Not on file  . Emotionally Abused: Not on file  . Physically Abused: Not on file  . Sexually Abused: Not on file      Objective:   Physical Exam Vitals reviewed.  Constitutional:      Appearance: Normal appearance. She is obese.  HENT:     Head: Normocephalic and atraumatic.  Cardiovascular:     Rate and Rhythm: Normal rate and regular rhythm.     Heart sounds: Normal heart sounds.  Pulmonary:     Effort: Pulmonary effort is normal.     Breath sounds: Normal  breath sounds.  Abdominal:     General: Abdomen is flat. There is no distension.     Palpations: Abdomen is soft. There is no mass.     Tenderness: There is no abdominal tenderness.  Genitourinary:    General: Normal vulva.     Comments: SVE: exam suboptimal due to body habitus Bilateral adnexa nontender and no mass detected No CMT Uterus normal in size Musculoskeletal:        General: Normal range of motion.  Skin:    General: Skin is warm and dry.  Neurological:     General: No focal deficit present.     Mental Status: She is alert and oriented to person, place, and time.    Vitals:   09/13/20 1039  BP: (!) 155/94  Pulse: 73         Assessment & Plan:   1. Cyst of right ovary Pt had TOA a year ago which was drained and she received appropriate antibiotics.  [place] from June showed 3.8 cm complex in  the right adnexa.  I believe this would be residual scarring from her TOA a year ago.  She is asymptomatic.   At this time recommend repeat u/s in 2-3 months with f/u after the scan Would not pursue re-operation unless pain recovers or the mass is dramatically different in size due to significant risk of complication. - US PELVIS (TRANSABDOMINAL ONLY); Future

## 2020-09-13 NOTE — Progress Notes (Signed)
States she was checking on her cyst

## 2020-09-20 ENCOUNTER — Ambulatory Visit: Admission: RE | Admit: 2020-09-20 | Payer: Medicaid Other | Source: Ambulatory Visit

## 2020-09-27 ENCOUNTER — Other Ambulatory Visit: Payer: Self-pay

## 2020-09-27 ENCOUNTER — Ambulatory Visit
Admission: RE | Admit: 2020-09-27 | Discharge: 2020-09-27 | Disposition: A | Discharge status: Home / Self Care | Payer: BLUE CROSS/BLUE SHIELD | Source: Ambulatory Visit | Attending: Obstetrics and Gynecology | Admitting: Obstetrics and Gynecology

## 2020-09-27 ENCOUNTER — Other Ambulatory Visit: Payer: Self-pay | Admitting: Obstetrics and Gynecology

## 2020-09-27 DIAGNOSIS — N83201 Unspecified ovarian cyst, right side: Secondary | ICD-10-CM | POA: Diagnosis present

## 2020-09-28 ENCOUNTER — Telehealth: Payer: Self-pay | Admitting: *Deleted

## 2020-09-28 DIAGNOSIS — N9489 Other specified conditions associated with female genital organs and menstrual cycle: Secondary | ICD-10-CM

## 2020-09-28 NOTE — Telephone Encounter (Signed)
Have sent message to Dr. Donavan Foil to clarify MRI pelvis with and without contrast or other?  Yehudis Monceaux,RN

## 2020-09-28 NOTE — Telephone Encounter (Signed)
-----   Message from Warden Fillers, MD sent at 09/28/2020  2:45 PM EDT ----- Abnormal findings noted on ultrasound, recommend MRI of pelvis for further evaluation and then office follow up

## 2020-10-06 NOTE — Telephone Encounter (Addendum)
Per Dr. Donavan Foil- MRI  Of pelvis with contrast is what he would like ordered. Order placed and called to schedule. Notified order needs to be With and without contrast. New order placed. Scheduled for 10/20/20 at 0900.   Called [facility] and notified her of order for MRI and appointment information. She  States she also has [employer] and I instructed her to bring that card with her to her appointment to be scanned into her account or by our office .She voices understanding.  Erial Fikes,RN

## 2020-10-19 ENCOUNTER — Telehealth: Payer: Self-pay | Admitting: Lactation Services

## 2020-10-19 NOTE — Telephone Encounter (Signed)
Patient called in and Left message on nurse voicemail.   She reports she has spoken with [facility] about her MRI and Pre Authorization has not been completed. She would like to know the status of getting her MRI Scheduled.

## 2020-10-20 ENCOUNTER — Encounter: Payer: Self-pay | Admitting: Lactation Services

## 2020-10-20 ENCOUNTER — Ambulatory Visit (HOSPITAL_COMMUNITY): Admission: RE | Admit: 2020-10-20 | Payer: BLUE CROSS/BLUE SHIELD | Source: Ambulatory Visit

## 2020-10-20 DIAGNOSIS — N9489 Other specified conditions associated with female genital organs and menstrual cycle: Secondary | ICD-10-CM | POA: Insufficient documentation

## 2020-10-20 NOTE — Telephone Encounter (Signed)
Called [place] code of 56433 and Dx code of N 94.89 to Kristie Santos for finalize PA.   Returned patients call to let her know that the PA is being worked on and she will be called with another date for her MRI. Patient voiced understanding.

## 2020-10-20 NOTE — Telephone Encounter (Signed)
Hi Jeanetta & Jasmine December,  Please what is the CPT & Diagnosis code for the MRI for this patient???  Oleh Genin [phone]

## 2020-11-01 ENCOUNTER — Ambulatory Visit (HOSPITAL_COMMUNITY): Admission: RE | Admit: 2020-11-01 | Payer: BLUE CROSS/BLUE SHIELD | Source: Ambulatory Visit

## 2020-11-04 ENCOUNTER — Emergency Department: Payer: No Typology Code available for payment source

## 2020-11-04 ENCOUNTER — Other Ambulatory Visit: Payer: Self-pay

## 2020-11-04 ENCOUNTER — Emergency Department
Admission: EM | Admit: 2020-11-04 | Discharge: 2020-11-04 | Disposition: A | Discharge status: Home / Self Care | Payer: No Typology Code available for payment source | Attending: Emergency Medicine | Admitting: Emergency Medicine

## 2020-11-04 ENCOUNTER — Encounter: Payer: Self-pay | Admitting: Physician Assistant

## 2020-11-04 DIAGNOSIS — M7662 Achilles tendinitis, left leg: Secondary | ICD-10-CM | POA: Insufficient documentation

## 2020-11-04 DIAGNOSIS — M79672 Pain in left foot: Secondary | ICD-10-CM | POA: Diagnosis present

## 2020-11-04 DIAGNOSIS — M7732 Calcaneal spur, left foot: Secondary | ICD-10-CM | POA: Diagnosis not present

## 2020-11-04 DIAGNOSIS — I10 Essential (primary) hypertension: Secondary | ICD-10-CM | POA: Diagnosis not present

## 2020-11-04 MED ORDER — DICLOFENAC SODIUM 75 MG PO TBEC: 75.0000 mg | DELAYED_RELEASE_TABLET | Freq: Two times a day (BID) | ORAL | 0 refills | Status: AC

## 2020-11-04 NOTE — ED Triage Notes (Signed)
Pt in with co persistent pain to posterior left ankle radiating into left heel for over a month. States started over a month ago, unsure of injury. Pt is ambulatory.

## 2020-11-04 NOTE — Discharge Instructions (Signed)
Your exam and x-ray are consistent with heel spurs and some mild Achilles tendinitis.  Wear the cam boot as directed.  Take the anti-inflammatory as prescribed.  Follow-up with Dr. Graciela Husbands in podiatry for ongoing symptoms.

## 2020-11-04 NOTE — ED Provider Notes (Signed)
[facility] Emergency Department Provider Note ____________________________________________  Time seen: 2040  I have reviewed the triage vital signs and the nursing notes.  HISTORY  Chief Complaint  Foot Pain  HPI Kristie Santos is a 44 y.o. female presents her self to the ED for evaluation of left ankle and heel pain.  Patient describes symptoms been present for over a month.  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  She also notes that yesterday she had insult injury when she stepped backwards off of a 2 step footstool, landing abruptly on her feet.  She denies any other injury at this time.  Has not been evaluated by any other provider for her symptoms.   Past Medical History:  Diagnosis Date  . HTN (hypertension)     Patient Active Problem List   Diagnosis Date Noted  . 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 Ovarian cyst 09/13/2020  . Sepsis (HCC) 07/19/2019  . UTI (urinary tract infection) 07/19/2019  . HTN (hypertension) 07/19/2019    Past Surgical History:  Procedure Laterality Date  . CHOLECYSTECTOMY    . LAPAROSCOPY N/A 07/24/2019   Procedure: OPERATIVE LAPAROSCOPY  WITH IRRIGATION AND DEBRIDEMENT OF PELVIC WALL ABSCESS;  Surgeon: Christeen Douglas, MD;  Location: [facility] ORS;  Service: Gynecology;  Laterality: N/A;  . TONSILLECTOMY      Prior to Admission medications   Medication Sig Start Date End Date Taking? Authorizing Provider  acetaminophen (TYLENOL) 325 MG tablet Take 650 mg by mouth every 6 (six) hours as needed.    [provider]  diclofenac (VOLTAREN) 75 MG EC tablet Take 1 tablet (75 mg total) by mouth 2 (two) times daily. 11/04/20 12/04/20  Jahi Roza, Charlesetta Ivory, PA-C    Allergies Patient has no known allergies.  Family History  Problem Relation Age of Onset  . Diabetes Mother     Social History Social History   Tobacco Use  . Smoking status: Never Smoker  . Smokeless  tobacco: Never Used  Substance Use Topics  . Alcohol use: No  . Drug use: No    Review of Systems  Constitutional: Negative for fever. Cardiovascular: Negative for chest pain. Respiratory: Negative for shortness of breath. Gastrointestinal: Negative for abdominal pain, vomiting and diarrhea. Genitourinary: Negative for dysuria. Musculoskeletal: Negative for back pain.  Left heel pain as above. Skin: Negative for rash. Neurological: Negative for headaches, focal weakness or numbness. ____________________________________________  PHYSICAL EXAM:  VITAL SIGNS: ED Triage Vitals [11/04/20 1947]  Enc Vitals Group     BP 125/83     Pulse Rate 61     Resp 20     Temp 98.3 F (36.8 C)     Temp Source Oral     SpO2 98 %     Weight 245 lb (111.1 kg)     Height 5\' 3"  (1.6 m)     Head Circumference      Peak Flow      Pain Score 5     Pain Loc      Pain Edu?      Excl. in GC?     Constitutional: Alert and oriented. Well appearing and in no distress. Head: Normocephalic and atraumatic. Eyes: Conjunctivae are normal. Normal extraocular movements Cardiovascular: Normal rate, regular rhythm. Normal distal pulses. Respiratory: Normal respiratory effort. No wheezes/rales/rhonchi. Musculoskeletal: Left foot and heel without obvious deformity or dislocation.  No joint effusion is appreciated.  Patient is tender to palpation to the Achilles  attachment on the calcaneal heel.  No plantar surface pain is elicited.  Ankle exam is otherwise benign at this time.  No significant calf tenderness is appreciated proximally.  Nontender with normal range of motion in all extremities.  Neurologic: Mildly antalgic gait without ataxia. Normal speech and language. No gross focal neurologic deficits are appreciated. Skin:  Skin is warm, dry and intact. No rash noted. Psychiatric: Mood and affect are normal. Patient exhibits appropriate insight and judgment. ____________________________________________    RADIOLOGY  DG Left Foot  IMPRESSION: Small plantar calcaneal spur. ____________________________________________  PROCEDURES  CAM boot  Procedures ____________________________________________  INITIAL IMPRESSION / ASSESSMENT AND PLAN / ED COURSE  DDX; plantar fasciitis, heel spur, achilles tendinitis, sprain, fracture  Patient with ED evaluation of persistent pain to the posterior heel and a an acute injury resulting in likely Achilles tendinitis.  Patient's x-ray reveals both plantar and posterior calcaneal spurs.  She be placed in a cam boot for benefit.  She is referred to podiatry for ongoing management.  Prescription for diclofenac is also provided for benefit.  She will follow-up as directed.  Work note was provided as requested.  Kristie Santos was evaluated in Emergency Department on 11/04/2020 for the symptoms described in the history of present illness. She was evaluated in the context of the global COVID-19 pandemic, which necessitated consideration that the patient might be at risk for infection with the SARS-CoV-2 virus that causes COVID-19. Institutional protocols and algorithms that pertain to the evaluation of patients at risk for COVID-19 are in a state of rapid change based on information released by regulatory bodies including the CDC and federal and state organizations. These policies and algorithms were followed during the patient's care in the ED. ____________________________________________  FINAL CLINICAL IMPRESSION(S) / ED DIAGNOSES  Final diagnoses:  Heel spur, left  Achilles tendinitis of left lower extremity      Michel Hendon, Charlesetta Ivory, PA-C 11/04/20 2317    Gilles Chiquito, MD 11/04/20 2317

## 2020-12-01 ENCOUNTER — Telehealth: Payer: Self-pay | Admitting: Obstetrics and Gynecology

## 2020-12-01 NOTE — Telephone Encounter (Signed)
Kristie Santos from [facility] called to make you aware that the MRI that was scheduled for this pt was denied... Notes:  No [employer] manager (Per [employer] E online Denied. patient is aware. Ad).   Thank you

## 2020-12-03 ENCOUNTER — Ambulatory Visit (HOSPITAL_COMMUNITY): Admission: RE | Admit: 2020-12-03 | Payer: BLUE CROSS/BLUE SHIELD | Source: Ambulatory Visit

## 2020-12-09 ENCOUNTER — Telehealth: Payer: Self-pay | Admitting: Family

## 2020-12-09 NOTE — Telephone Encounter (Signed)
New patient appointment scheduled & lwtter& calendar was mailed as well 

## 2020-12-15 ENCOUNTER — Telehealth: Payer: Self-pay | Admitting: *Deleted

## 2020-12-15 NOTE — Telephone Encounter (Signed)
Preauthorization for MRI obtained from [facility].  Preauth for MRI pelvis with and without contrast CPT [id number] for diagnosis hypoechoic area in right ovary ICD 10 code N83.01.  Auth # [id number] valid 12/15/20 - 06/12/21.  Appointment scheduled for patient on 12/27/20 at 11 am.  Patient to arrive at 1030 am.  MRI will be performed at [facility].  Contacted patient with appointment information.  Encouraged patient to reschedule appointment should she develop any symptoms of covid.  Patient states understanding.

## 2020-12-23 ENCOUNTER — Telehealth: Payer: Self-pay | Admitting: Obstetrics and Gynecology

## 2020-12-23 DIAGNOSIS — N9489 Other specified conditions associated with female genital organs and menstrual cycle: Secondary | ICD-10-CM

## 2020-12-23 NOTE — Telephone Encounter (Signed)
Order for Pelvic MRI faxed to [facility] at [phone].

## 2020-12-23 NOTE — Telephone Encounter (Signed)
Pt called and stated that the MRI that Dr. Donavan Foil scheduled for [facility] was out of network for this pt and she wants to talk to someone so that she can give in network facility so a new order for MRI can be sent to that facility. Please call pt back. Drue Second

## 2020-12-23 NOTE — Telephone Encounter (Addendum)
Returned patients call.   Patient reports she needs her MRI to be scheduled with an In Location manager.   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 Patient lives in [place] and had no preferences for date and time.   Statistician at [address], [place], [place] [address] to schedule. They registered patient and will need an referral/order faxed to them to be able to schedule. Their fax number is [phone]. Order printed to be faxed. They will call patient to schedule once order received.

## 2020-12-27 ENCOUNTER — Ambulatory Visit (HOSPITAL_COMMUNITY): Admission: RE | Admit: 2020-12-27 | Payer: BLUE CROSS/BLUE SHIELD | Source: Ambulatory Visit

## 2021-01-03 ENCOUNTER — Other Ambulatory Visit: Payer: Self-pay | Admitting: Family

## 2021-01-03 DIAGNOSIS — D509 Iron deficiency anemia, unspecified: Secondary | ICD-10-CM

## 2021-01-03 DIAGNOSIS — D72829 Elevated white blood cell count, unspecified: Secondary | ICD-10-CM

## 2021-01-04 ENCOUNTER — Inpatient Hospital Stay (HOSPITAL_BASED_OUTPATIENT_CLINIC_OR_DEPARTMENT_OTHER): Payer: BLUE CROSS/BLUE SHIELD | Admitting: Family

## 2021-01-04 ENCOUNTER — Encounter: Payer: Self-pay | Admitting: *Deleted

## 2021-01-04 ENCOUNTER — Other Ambulatory Visit: Payer: Self-pay

## 2021-01-04 ENCOUNTER — Inpatient Hospital Stay: Payer: BLUE CROSS/BLUE SHIELD | Attending: Family

## 2021-01-04 ENCOUNTER — Encounter: Payer: Self-pay | Admitting: Family

## 2021-01-04 VITALS — BP 162/93 | HR 62 | Temp 97.8°F | Resp 18 | Ht 62.0 in | Wt 250.0 lb

## 2021-01-04 DIAGNOSIS — D500 Iron deficiency anemia secondary to blood loss (chronic): Secondary | ICD-10-CM | POA: Diagnosis not present

## 2021-01-04 DIAGNOSIS — I10 Essential (primary) hypertension: Secondary | ICD-10-CM | POA: Diagnosis not present

## 2021-01-04 DIAGNOSIS — R5383 Other fatigue: Secondary | ICD-10-CM

## 2021-01-04 DIAGNOSIS — Z833 Family history of diabetes mellitus: Secondary | ICD-10-CM | POA: Insufficient documentation

## 2021-01-04 DIAGNOSIS — Z9049 Acquired absence of other specified parts of digestive tract: Secondary | ICD-10-CM | POA: Insufficient documentation

## 2021-01-04 DIAGNOSIS — Z803 Family history of malignant neoplasm of breast: Secondary | ICD-10-CM | POA: Diagnosis not present

## 2021-01-04 DIAGNOSIS — N920 Excessive and frequent menstruation with regular cycle: Secondary | ICD-10-CM

## 2021-01-04 DIAGNOSIS — D509 Iron deficiency anemia, unspecified: Secondary | ICD-10-CM

## 2021-01-04 DIAGNOSIS — D72829 Elevated white blood cell count, unspecified: Secondary | ICD-10-CM

## 2021-01-04 LAB — CMP (CANCER CENTER ONLY)
ALT: 20 U/L (ref 0–44)
AST: 22 U/L (ref 15–41)
Albumin: 3.9 g/dL (ref 3.5–5.0)
Alkaline Phosphatase: 75 U/L (ref 38–126)
Anion gap: 8 (ref 5–15)
BUN: 17 mg/dL (ref 6–20)
CO2: 26 mmol/L (ref 22–32)
Calcium: 8.8 mg/dL — ABNORMAL LOW (ref 8.9–10.3)
Chloride: 103 mmol/L (ref 98–111)
Creatinine: 1 mg/dL (ref 0.44–1.00)
GFR, Estimated: >60 mL/min (ref >60)
Glucose, Bld: 103 mg/dL — ABNORMAL HIGH (ref 70–99)
Potassium: 3.5 mmol/L (ref 3.5–5.1)
Sodium: 137 mmol/L (ref 135–145)
Total Bilirubin: 0.4 mg/dL (ref 0.3–1.2)
Total Protein: 7.4 g/dL (ref 6.5–8.1)

## 2021-01-04 LAB — CBC WITH DIFFERENTIAL (CANCER CENTER ONLY)
Abs Immature Granulocytes: 0.02 10*3/uL (ref 0.00–0.07)
Basophils Absolute: 0.1 10*3/uL (ref 0.0–0.1)
Basophils Relative: 1 %
Eosinophils Absolute: 0.3 10*3/uL (ref 0.0–0.5)
Eosinophils Relative: 3 %
HCT: 42.6 % (ref 36.0–46.0)
Hemoglobin: 13.3 g/dL (ref 12.0–15.0)
Immature Granulocytes: 0 %
Lymphocytes Relative: 41 %
Lymphs Abs: 3.9 10*3/uL (ref 0.7–4.0)
MCH: 25.5 pg — ABNORMAL LOW (ref 26.0–34.0)
MCHC: 31.2 g/dL (ref 30.0–36.0)
MCV: 81.8 fL (ref 80.0–100.0)
Monocytes Absolute: 0.6 10*3/uL (ref 0.1–1.0)
Monocytes Relative: 6 %
Neutro Abs: 4.6 10*3/uL (ref 1.7–7.7)
Neutrophils Relative %: 49 %
Platelet Count: 198 10*3/uL (ref 150–400)
RBC: 5.21 MIL/uL — ABNORMAL HIGH (ref 3.87–5.11)
RDW: 16.2 % — ABNORMAL HIGH (ref 11.5–15.5)
WBC Count: 9.5 10*3/uL (ref 4.0–10.5)
nRBC: 0 % (ref 0.0–0.2)

## 2021-01-04 LAB — IRON AND TIBC
Iron: 59 ug/dL (ref 41–142)
Saturation Ratios: 17 % — ABNORMAL LOW (ref 21–57)
TIBC: 353 ug/dL (ref 236–444)
UIBC: 293 ug/dL (ref 120–384)

## 2021-01-04 LAB — RETICULOCYTES
Immature Retic Fract: 20.1 % — ABNORMAL HIGH (ref 2.3–15.9)
RBC.: 5.2 MIL/uL — ABNORMAL HIGH (ref 3.87–5.11)
Retic Count, Absolute: 72.3 10*3/uL (ref 19.0–186.0)
Retic Ct Pct: 1.4 % (ref 0.4–3.1)

## 2021-01-04 LAB — LACTATE DEHYDROGENASE: LDH: 169 U/L (ref 98–192)

## 2021-01-04 LAB — SAVE SMEAR(SSMR), FOR PROVIDER SLIDE REVIEW

## 2021-01-04 LAB — FERRITIN: Ferritin: 26 ng/mL (ref 11–307)

## 2021-01-04 NOTE — Progress Notes (Addendum)
Hematology/Oncology Consultation   Name: Kristie Santos      MRN: [id number]    Location: Room/bed info not found  Date: 01/04/2021 Time:8:56 AM   REFERRING PHYSICIAN: Laruth Bouchard, MD  REASON FOR CONSULT: Elevated WBC count and anemia    DIAGNOSIS:  Leukocytosis - reactive Anemia - heavy cycles   HISTORY OF PRESENT ILLNESS: Kristie Santos is a very pleasant 45 yo African American female with history of intermittent mild leukocytosis dating back to at least 2012 with lab work in care everywhere.   She has heavy irregular cycles sometimes occurring twice a month.  She had a TVUS in October which showed a 3.5 x 2.4 x 1.9 cm lobulated mass is now noted in the endocervical canal and 6.6 x 3.9 x 2.8 cm multi-septated cystic mass is noted in left adnexal region that were concerning for malignancy. MRI was recommended for further evaluation. She is still waiting to schedule. There has been an issue with getting her scheduled with an in network provider and she changed insurance this month. Dr. Donavan Foil' [facility]) has been working diligently to get this scheduled for her.  She has not noted any other blood loss. No abnormal bruising, no petechiae.  She states that she has history of one miscarriage within the first 8 weeks. No other pregnancy or loss.  Her counts today are improved. WBC count is 9.5, Hgb 13.3, MCV 81 and platelets 198.  She is not currently taking an oral iron supplement. She does take Biotin.  She has mild fatigue at times.  She denies issues with frequent/persistent infections.  No cervical, supraclavicular or axillary adenopathy noted on exam.  No fever, chills, n/v, cough, rash, dizziness, SOB, chest pain, palpitations, abdominal pain or changes in bowel or bladder habits.  Her maternal grandmother had history of breast cancer. She has no known personal history of cancer.  No personal history of diabetes or thyroid disease.  No swelling, tenderness, numbness or tingling in her  extremities.  She has maintained a good appetite and is staying well hydrated. Her weight is described as stable.  She states that she is not up to date on her mammogram and needs to schedule.  She has never had EGD or colonoscopy.  Only surgery was I&D of pelvic wall abscess by Dr. Virgel Manifold. She states that this went well with no complications.  No smoking or recreational drug use. She rarely has an alcoholic beverage socially.  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   ROS: All other 10 point review of systems is negative.   PAST MEDICAL HISTORY:   Past Medical History:  Diagnosis Date  . HTN (hypertension)     ALLERGIES: No Known Allergies    MEDICATIONS:  Current Outpatient Medications on File Prior to Visit  Medication Sig Dispense Refill  . acetaminophen (TYLENOL) 325 MG tablet Take 650 mg by mouth every 6 (six) hours as needed.     No current facility-administered medications on file prior to visit.     PAST SURGICAL HISTORY Past Surgical History:  Procedure Laterality Date  . CHOLECYSTECTOMY    . LAPAROSCOPY N/A 07/24/2019   Procedure: OPERATIVE LAPAROSCOPY  WITH IRRIGATION AND DEBRIDEMENT OF PELVIC WALL ABSCESS;  Surgeon: Christeen Douglas, MD;  Location: [facility] ORS;  Service: Gynecology;  Laterality: N/A;  . TONSILLECTOMY      FAMILY HISTORY: Family History  Problem Relation Age of Onset  . Diabetes Mother     SOCIAL HISTORY:  reports  that she has never smoked. She has never used smokeless tobacco. She reports that she does not drink alcohol and does not use drugs.  PERFORMANCE STATUS: The patient's performance status is 0 - Asymptomatic  PHYSICAL EXAM: Most Recent Vital Signs: There were no vitals taken for this visit. BP (!) 162/93 (BP Location: Left Arm, Patient Position: Sitting)   Pulse 62   Temp 97.8 F (36.6 C) (Oral)   Resp 18   Ht 5\' 2"  (1.575 m)   Wt 250 lb (113.4 kg)   SpO2 100%   BMI 45.73 kg/m   General  Appearance:    Alert, cooperative, no distress, appears stated age  Head:    Normocephalic, without obvious abnormality, atraumatic  Eyes:    PERRL, conjunctiva/corneas clear, EOM's intact, fundi    benign, both eyes        Throat:   Lips, mucosa, and tongue normal; teeth and gums normal  Neck:   Supple, symmetrical, trachea midline, no adenopathy;    thyroid:  no enlargement/tenderness/nodules; no carotid   bruit or JVD  Back:     Symmetric, no curvature, ROM normal, no CVA tenderness  Lungs:     Clear to auscultation bilaterally, respirations unlabored  Chest Wall:    No tenderness or deformity   Heart:    Regular rate and rhythm, S1 and S2 normal, no murmur, rub   or gallop     Abdomen:     Soft, non-tender, bowel sounds active all four quadrants,    no masses, no organomegaly        Extremities:   Extremities normal, atraumatic, no cyanosis or edema  Pulses:   2+ and symmetric all extremities  Skin:   Skin color, texture, turgor normal, no rashes or lesions  Lymph nodes:   Cervical, supraclavicular, and axillary nodes normal  Neurologic:   CNII-XII intact, normal strength, sensation and reflexes    throughout    LABORATORY DATA:  Results for orders placed or performed in visit on 01/04/21 (from the past 48 hour(s))  Save Smear (SSMR)     Status: None   Collection Time: 01/04/21  8:30 AM  Result Value Ref Range   Smear Review SMEAR STAINED AND AVAILABLE FOR REVIEW     Comment: Performed at [facility] at [facility], [address], [place], [place]  CBC with Differential (Cancer Center Only)     Status: Abnormal   Collection Time: 01/04/21  8:30 AM  Result Value Ref Range   WBC Count 9.5 4.0 - 10.5 K/uL   RBC 5.21 (H) 3.87 - 5.11 MIL/uL   Hemoglobin 13.3 12.0 - 15.0 g/dL   HCT 03/04/21 48.2 - 70.7 %   MCV 81.8 80.0 - 100.0 fL   MCH 25.5 (L) 26.0 - 34.0 pg   MCHC 31.2 30.0 - 36.0 g/dL   RDW 86.7 (H) 54.4 - 92.0 %   Platelet Count 198  150 - 400 K/uL   nRBC 0.0 0.0 - 0.2 %   Neutrophils Relative % 49 %   Neutro Abs 4.6 1.7 - 7.7 K/uL   Lymphocytes Relative 41 %   Lymphs Abs 3.9 0.7 - 4.0 K/uL   Monocytes Relative 6 %   Monocytes Absolute 0.6 0.1 - 1.0 K/uL   Eosinophils Relative 3 %   Eosinophils Absolute 0.3 0.0 - 0.5 K/uL   Basophils Relative 1 %   Basophils Absolute 0.1 0.0 - 0.1 K/uL   Immature Granulocytes  0 %   Abs Immature Granulocytes 0.02 0.00 - 0.07 K/uL    Comment: Performed at [facility] at [facility], [address], [place], [place] [address]      RADIOGRAPHY: No results found.     PATHOLOGY: None  ASSESSMENT/PLAN:  Kristie Santos is a very pleasant 45 yo [place] American female with history of intermittent mild leukocytosis dating back to at least 2012 as well as intermittent anemia over the last 1-2 years.  WBC count and Hgb are within normal limits today.  She is mildly iron deficiency with a saturation of 17%. We would recommend that she take and oral iron supplement every other day.  Dr. Myna Hidalgo was able to review her blood smear and no abnormality or evidence of malignancy was noted.  At this time, we can let her go from our office. No follow-up needed.   All questions were answered and she is in agreement. She was encouraged to contact our office with any other questions or concerns. We can certainly see her for any future heme/onc issues.   The patient was discussed with Dr. Myna Hidalgo and he is in agreement with the aforementioned.   Emeline Gins, NP

## 2021-01-05 ENCOUNTER — Telehealth: Payer: Self-pay

## 2021-01-05 LAB — SURGICAL PATHOLOGY

## 2021-01-05 NOTE — Telephone Encounter (Signed)
01/04/21 los states :No follow-up needed     aom

## 2021-01-06 LAB — FLOW CYTOMETRY

## 2021-03-09 ENCOUNTER — Encounter: Payer: Self-pay | Admitting: Family

## 2021-03-09 ENCOUNTER — Telehealth: Payer: Self-pay | Admitting: Lactation Services

## 2021-03-09 NOTE — Telephone Encounter (Signed)
Patient called and is requesting results of MRI. She is requesting a call back.

## 2021-03-10 ENCOUNTER — Telehealth: Payer: Self-pay | Admitting: Lactation Services

## 2021-03-10 ENCOUNTER — Encounter: Payer: Self-pay | Admitting: Family

## 2021-03-10 NOTE — Telephone Encounter (Signed)
Spoke with Dr. Donavan Foil and then called and informed patient that MRI showe3d a normal uterus and Hydro Salphyx. Recommended to patient that she make a follow up appointment in the office to discuss results and recommendations for follow up. Patient voiced understanding.

## 2021-03-28 ENCOUNTER — Encounter: Payer: Self-pay | Admitting: Obstetrics and Gynecology

## 2021-03-28 ENCOUNTER — Ambulatory Visit (INDEPENDENT_AMBULATORY_CARE_PROVIDER_SITE_OTHER): Payer: Self-pay | Admitting: Obstetrics and Gynecology

## 2021-03-28 ENCOUNTER — Other Ambulatory Visit: Payer: Self-pay

## 2021-03-28 DIAGNOSIS — N7011 Chronic salpingitis: Secondary | ICD-10-CM

## 2021-03-28 NOTE — Progress Notes (Signed)
CC: gyn follow up of left adnexa Subjective:    Patient ID: Kristie Santos, female    DOB: 1976/08/19, 45 y.o.   MRN: [id number]  HPI  Pt seen for follow up of MRI.  Discussed MRI results showing a 5 cm physiologic right cyst and stable left complex fluid collection consistent with hydrosalpinx.  There has been no increase in size and no findings consistent with malignancy.  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  Pt has had intermittent mild pain on the left side but she is able to complete her activities of daily living.  Pt was somewhat curious about pursuing pregnancy.  Discussed advanced maternal age and the difficulty achieving spontaneous pregnancy especially with potentially damaged fallopian tubes.  Pt would need REI assistance for pregnancy.  At this time will pursue expectant management regarding left adnexa due to increased risk of complications with surgery.    Review of Systems     Objective:   Physical Exam Vitals:   03/28/21 1425  BP: (!) 164/115  Pulse: 83   Repeat BP 146/96  CLINICAL DATA:  Follow-up cervical and ovarian lesions seen on prior  ultrasound.   EXAM:  MRI PELVIS WITHOUT AND WITH CONTRAST   TECHNIQUE:  Multiplanar multisequence MR imaging of the pelvis was performed  both before and after administration of intravenous contrast.   CONTRAST:  10 mL Gadavist   COMPARISON:  Ultrasound on 09/27/2020   FINDINGS:  Lower Urinary Tract: No urinary bladder or urethral abnormality  identified.   Bowel: Unremarkable pelvic bowel loops.   Vascular/Lymphatic: Unremarkable. No pathologically enlarged pelvic  lymph nodes identified.   Reproductive:   -- Uterus: Measures 9.4 x 4.6 x 5.9 cm (volume = 130 cm^3). No  fibroids or other masses identified. Endometrial thickness measures  6 mm. Cervix and vagina are unremarkable. Previously seen fluid  collection in the  endocervical canal is no longer visualized today's  exam.   -- Right ovary: Simple cyst is seen measuring 5.1 x 4.3 cm, which is  new since previous study. This may be physiologic in a reproductive  age female.   -- Left ovary: Appears normal. A complex fluid collection with thin  internal septations and angular margins is seen adjacent to the left  ovary, which measures 5.8 x 2.3 cm. This is similar in size and  appearance to prior study, and likely represents a hydrosalpinx or  peritoneal inclusion cyst. No enhancing nodular or solid component  identified.   Other: No peritoneal thickening or abnormal free fluid.   Musculoskeletal:  Unremarkable.   IMPRESSION:  5.8 cm complex fluid collection with thin internal septations  adjacent to the left ovary, similar in size and appearance to prior  ultrasound. Differential diagnosis includes hydrosalpinx and  peritoneal inclusion cyst.   5.1 cm simple cyst in the right ovary, new since previous study, and  likely physiologic in a premenopausal female. No follow-up imaging  recommended. Note: This recommendation does not apply to  premenarchal patients and to those with increased risk (genetic,  family history, elevated tumor markers or other high-risk factors)  of ovarian cancer. Reference: JACR 2020 Feb; 17(2):248-254   Normal appearance of uterus. Previously seen fluid collection in the  endocervical canal is no longer visualized.    Electronically Signed    By: Danae Orleans M.D.    On: 01/24/2021 16:49        Assessment & Plan:  1. Hydrosalpinx Expectant management for now.  Would consider diagnostic laparoscopy if pt had chronic pain or worsening radiologic findings of left adnexa.  Review of chart showed no current pap for possibly 10 years.  Pt will have annual exam and pap in 6 weeks.  Pt advised to follow up with PCP in 1-2 months regarding her blood pressure  I spent 20 minutes dedicated to the care of this  patient including previsit review of records, face to face time with the patient discussing care plan and post visit testing.      Warden Fillers, MD Faculty Attending, [facility]

## 2021-03-28 NOTE — Progress Notes (Signed)
Pt has PCP, doesn't not have appt scheduled.

## 2021-05-09 ENCOUNTER — Ambulatory Visit: Payer: Medicaid Other | Admitting: Obstetrics and Gynecology

## 2021-08-02 IMAGING — US US TRANSVAGINAL NON-OB
2 series · 15 of 25 positions shown · non-contrast
Comparison: May 28, 2020.

CLINICAL DATA: Tubo-ovarian abscess in right ovarian cyst.

EXAM:
ULTRASOUND PELVIS TRANSVAGINAL
TECHNIQUE: Transvaginal ultrasound examination of the pelvis was performed
including evaluation of the uterus, ovaries, adnexal regions, and
pelvic cul-de-sac. Exam is limited due to overlying bowel gas.

[Series 1: us transvaginal non-ob · 14 of 82 slices shown (1 of 2)]
[im 1/82]
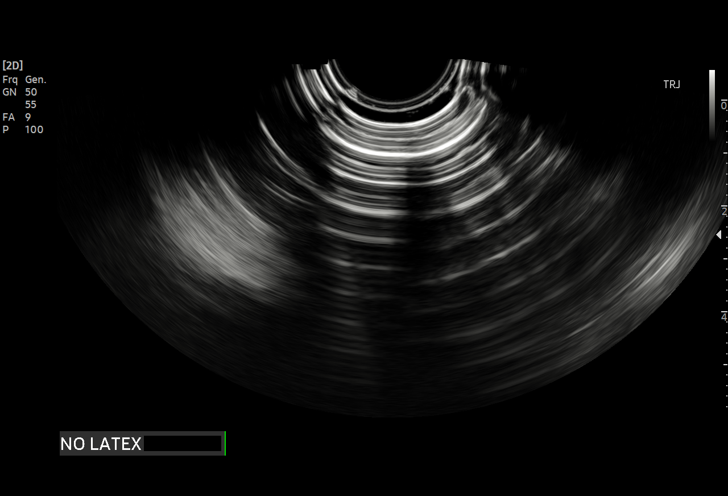
[im 8/82]
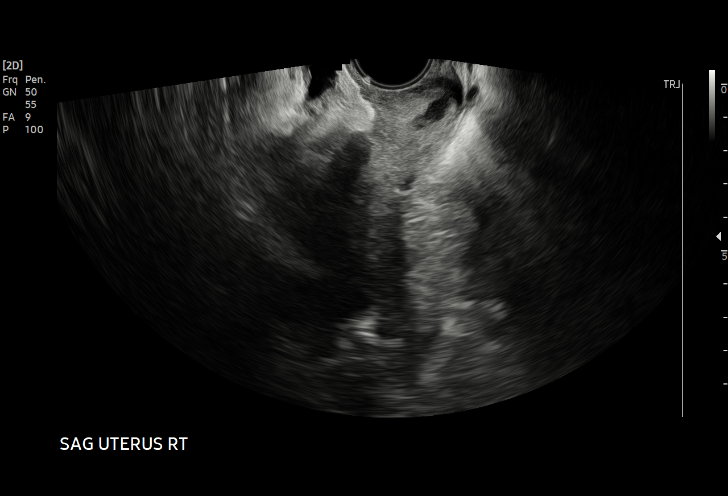
[im 15/82]
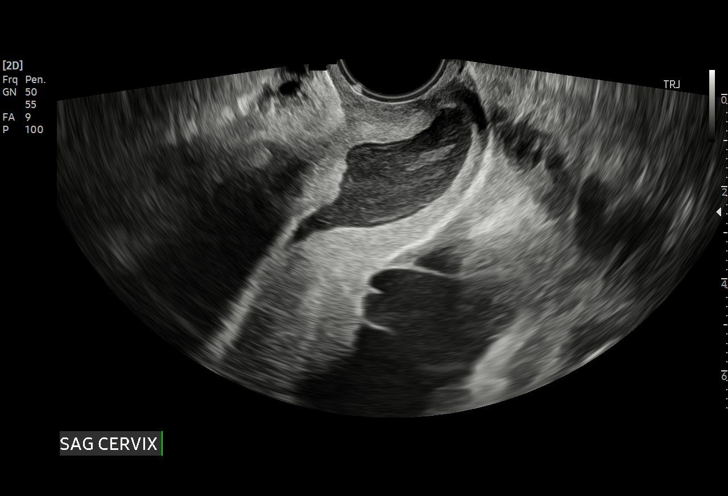
[im 18/82]
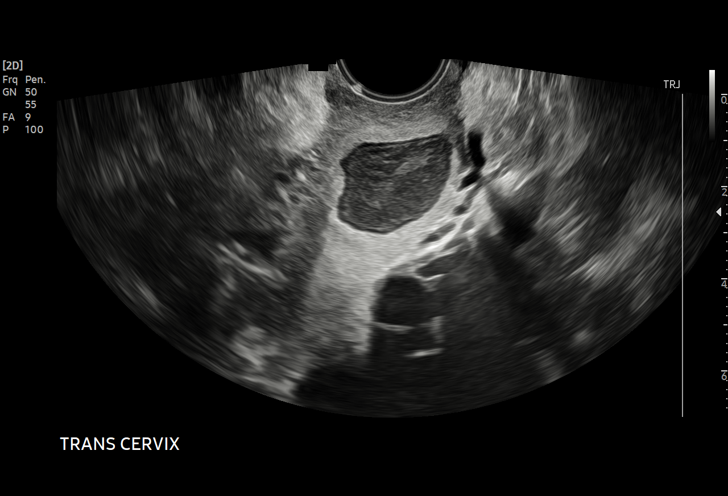
[im 25/82]
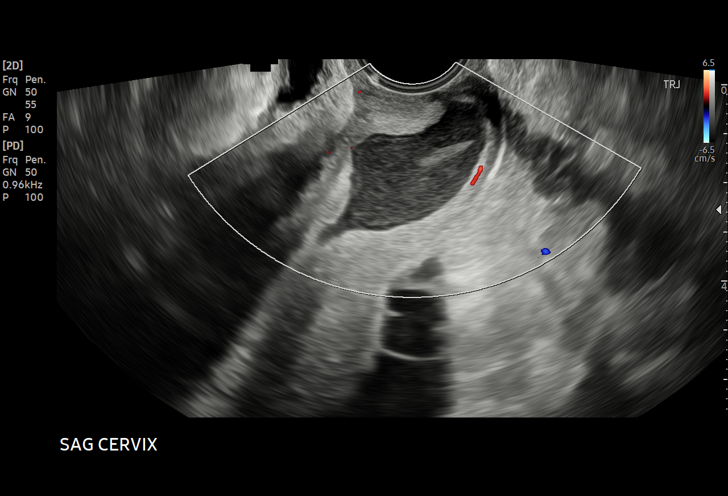
[im 32/82]
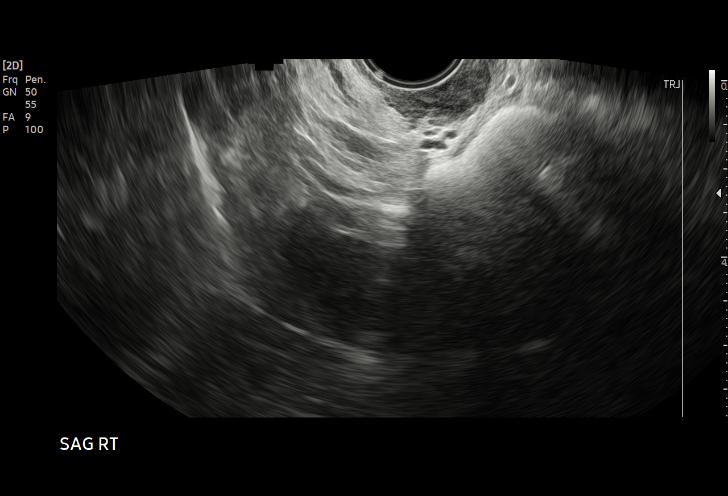
[im 36/82]
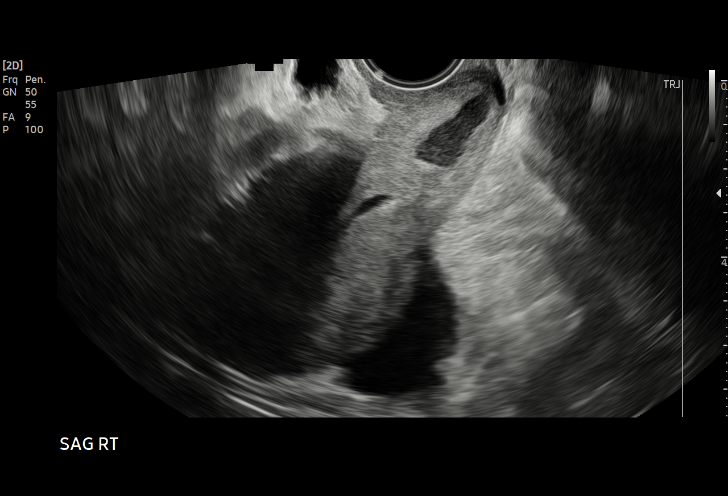
[im 43/82]
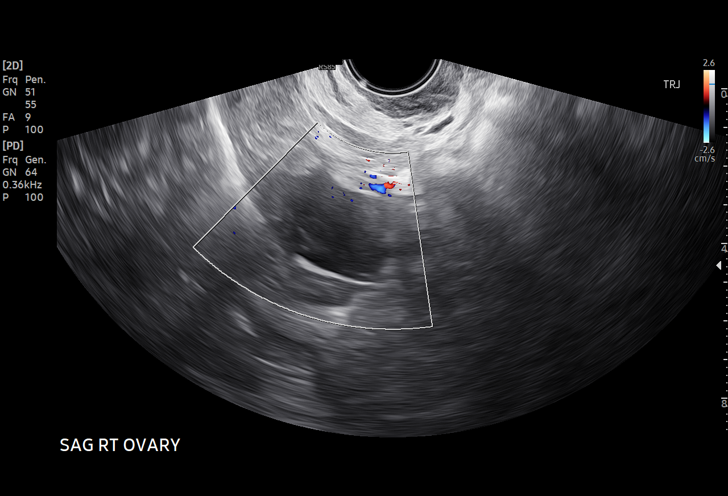
[im 50/82]
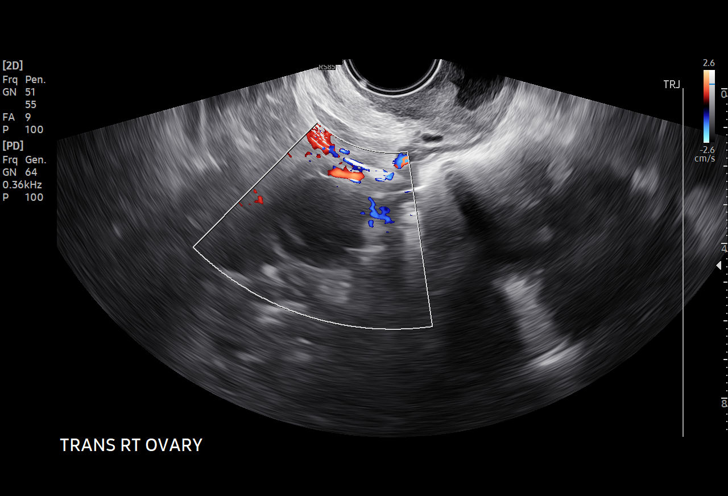
[im 53/82]
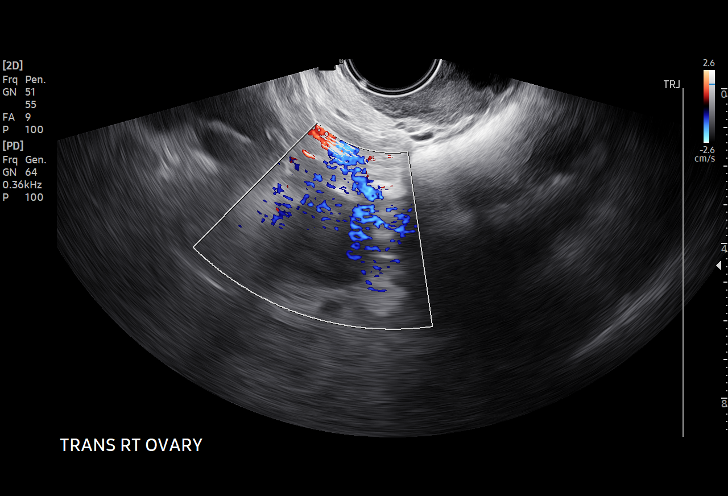
[im 60/82]
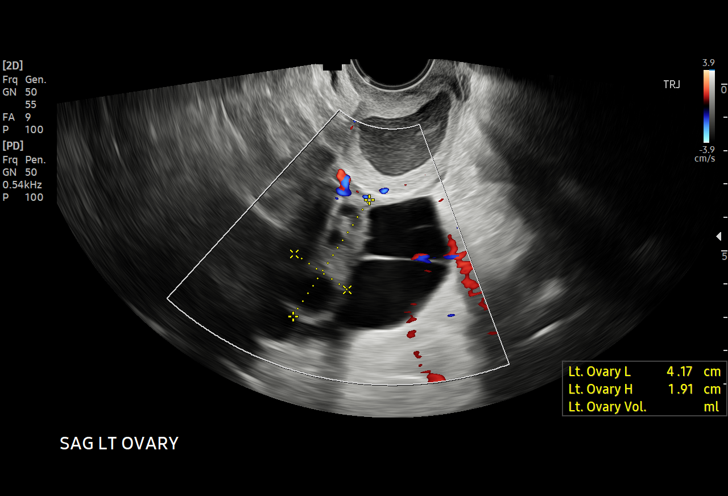
[im 67/82]
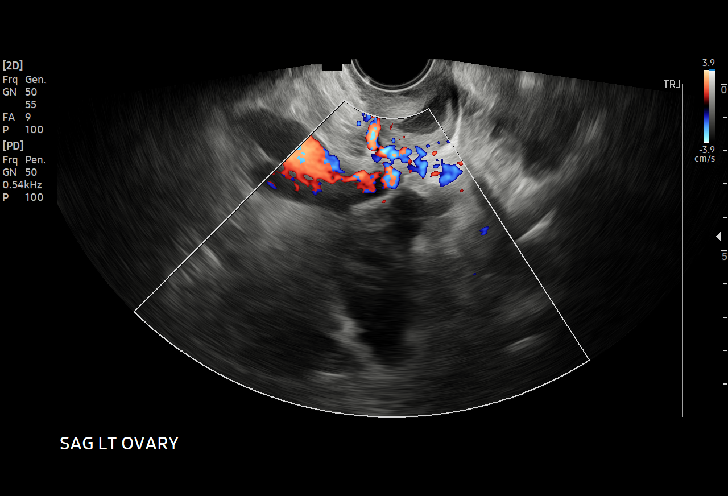
[im 71/82]
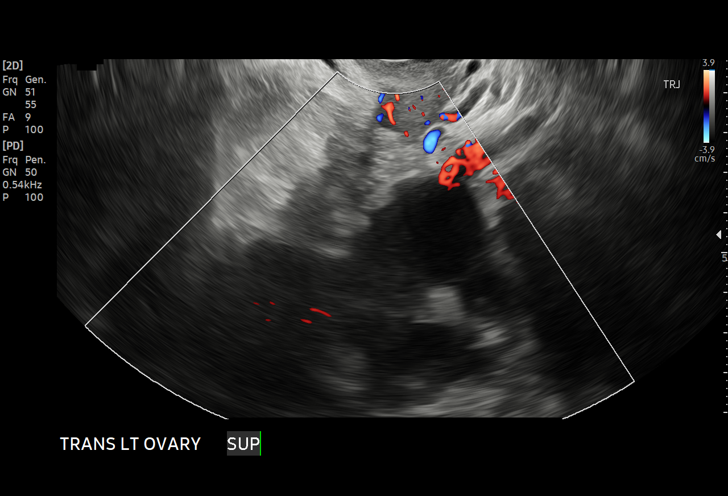
[im 78/82]
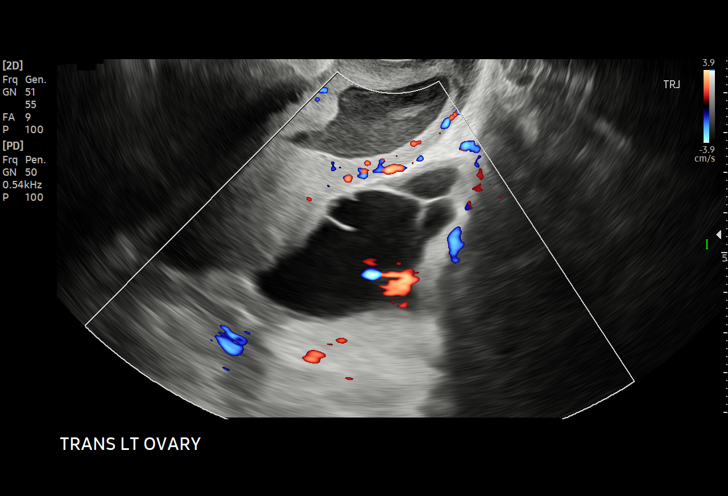

[Series 1: us transvaginal non-ob · 1 of 3 slices shown (2 of 2)]
[im 1/3]
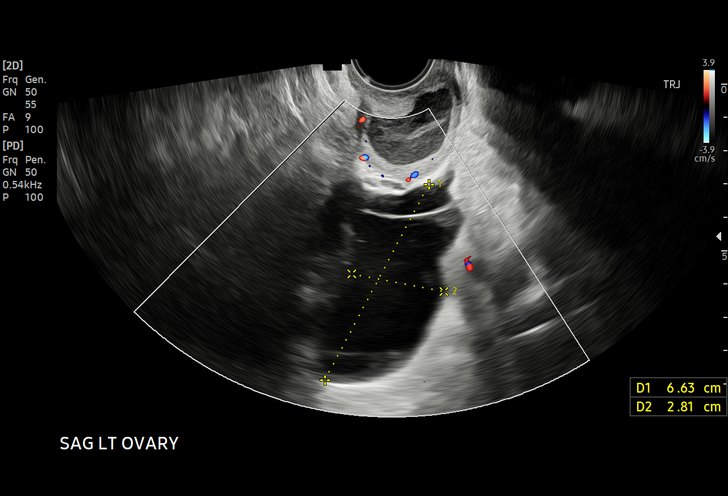

[15 of 25 positions shown; findings below may reference images not displayed]

FINDINGS: Uterus

Measurements: 9.2 x 5.0 x 4.2 cm = volume: 100 mL. No fibroids or
other mass visualized.

Endometrium

Thickness: 10 mm which is within normal limits. There is now noted a
lobulated mass in the endocervical canal that measures 3.5 x 2.4 x
1.9 cm concerning for possible neoplasm.

Right ovary

Measurements: 3.8 x 2.5 x 2.5 cm = volume: 12 mL. Ill-defined
hypoechoic area measuring 2.2 cm is noted which may represent
complex cyst.

Left ovary

Measurements: 4.2 x 2.4 x 1.9 cm = volume: 10 mL. 6.6 x 3.9 x 2.8 cm
multi-septated cystic mass is noted in the left adnexal region.

Other findings:  No abnormal free fluid
IMPRESSION: 1. 3.5 x 2.4 x 1.9 cm lobulated mass is now noted in the
endocervical canal concerning for possible neoplasm or malignancy.
Recommend MRI for further evaluation.
2. 6.6 x 3.9 x 2.8 cm multi-septated cystic mass is noted in left
adnexal region which is concerning for cystic ovarian neoplasm or
possibly hydrosalpinx. Further evaluation with MRI is recommended.
3. 2.2 cm hypoechoic area seen in right ovary which may represent
complex cyst. Follow-up ultrasound in 6-12 weeks is recommended to
ensure stability or resolution.

These results will be called to the ordering clinician or
representative by the Radiologist Assistant, and communication
documented in the PACS or [REDACTED].
